# Patient Record
Sex: Female | Born: 1975 | Hispanic: No | Marital: Married | Smoking: Former smoker
Health system: Southern US, Community
[De-identification: ages and names within clinical notes are randomized; demographics above are authoritative.]

## PROBLEM LIST (undated history)

## (undated) DIAGNOSIS — F329 Major depressive disorder, single episode, unspecified: Secondary | ICD-10-CM

## (undated) DIAGNOSIS — F419 Anxiety disorder, unspecified: Secondary | ICD-10-CM

## (undated) DIAGNOSIS — R002 Palpitations: Secondary | ICD-10-CM

## (undated) DIAGNOSIS — K5190 Ulcerative colitis, unspecified, without complications: Secondary | ICD-10-CM

## (undated) DIAGNOSIS — M542 Cervicalgia: Secondary | ICD-10-CM

## (undated) DIAGNOSIS — D649 Anemia, unspecified: Secondary | ICD-10-CM

## (undated) DIAGNOSIS — R079 Chest pain, unspecified: Secondary | ICD-10-CM

## (undated) DIAGNOSIS — N946 Dysmenorrhea, unspecified: Secondary | ICD-10-CM

## (undated) DIAGNOSIS — N938 Other specified abnormal uterine and vaginal bleeding: Secondary | ICD-10-CM

## (undated) DIAGNOSIS — F3281 Premenstrual dysphoric disorder: Secondary | ICD-10-CM

## (undated) DIAGNOSIS — R140 Abdominal distension (gaseous): Secondary | ICD-10-CM

## (undated) DIAGNOSIS — E282 Polycystic ovarian syndrome: Secondary | ICD-10-CM

## (undated) DIAGNOSIS — E320 Persistent hyperplasia of thymus: Secondary | ICD-10-CM

## (undated) DIAGNOSIS — N921 Excessive and frequent menstruation with irregular cycle: Secondary | ICD-10-CM

## (undated) DIAGNOSIS — N3010 Interstitial cystitis (chronic) without hematuria: Secondary | ICD-10-CM

## (undated) DIAGNOSIS — E349 Endocrine disorder, unspecified: Secondary | ICD-10-CM

## (undated) DIAGNOSIS — R000 Tachycardia, unspecified: Secondary | ICD-10-CM

## (undated) DIAGNOSIS — R55 Syncope and collapse: Secondary | ICD-10-CM

## (undated) DIAGNOSIS — Z87440 Personal history of urinary (tract) infections: Secondary | ICD-10-CM

## (undated) DIAGNOSIS — R601 Generalized edema: Secondary | ICD-10-CM

## (undated) DIAGNOSIS — Q231 Congenital insufficiency of aortic valve: Secondary | ICD-10-CM

## (undated) DIAGNOSIS — Z720 Tobacco use: Secondary | ICD-10-CM

## (undated) DIAGNOSIS — R61 Generalized hyperhidrosis: Secondary | ICD-10-CM

## (undated) DIAGNOSIS — F988 Other specified behavioral and emotional disorders with onset usually occurring in childhood and adolescence: Secondary | ICD-10-CM

## (undated) DIAGNOSIS — D249 Benign neoplasm of unspecified breast: Secondary | ICD-10-CM

## (undated) DIAGNOSIS — I471 Supraventricular tachycardia: Secondary | ICD-10-CM

## (undated) HISTORY — DX: Interstitial cystitis (chronic) without hematuria: N30.10

## (undated) HISTORY — DX: Chest pain, unspecified: R07.9

## (undated) HISTORY — DX: Ulcerative colitis, unspecified, without complications: K51.90

## (undated) HISTORY — DX: Tobacco use: Z72.0

## (undated) HISTORY — DX: Abdominal distension (gaseous): R14.0

## (undated) HISTORY — DX: Other specified abnormal uterine and vaginal bleeding: N93.8

## (undated) HISTORY — DX: Personal history of urinary (tract) infections: Z87.440

## (undated) HISTORY — DX: Polycystic ovarian syndrome: E28.2

## (undated) HISTORY — DX: Other specified behavioral and emotional disorders with onset usually occurring in childhood and adolescence: F98.8

## (undated) HISTORY — DX: Endocrine disorder, unspecified: E34.9

## (undated) HISTORY — DX: Persistent hyperplasia of thymus: E32.0

## (undated) HISTORY — DX: Tachycardia, unspecified: R00.0

## (undated) HISTORY — DX: Premenstrual dysphoric disorder: F32.81

## (undated) HISTORY — DX: Major depressive disorder, single episode, unspecified: F32.9

## (undated) HISTORY — DX: Benign neoplasm of unspecified breast: D24.9

## (undated) HISTORY — DX: Congenital insufficiency of aortic valve: Q23.1

## (undated) HISTORY — DX: Excessive and frequent menstruation with irregular cycle: N92.1

## (undated) HISTORY — DX: Dysmenorrhea, unspecified: N94.6

## (undated) HISTORY — DX: Generalized edema: R60.1

## (undated) HISTORY — DX: Syncope and collapse: R55

## (undated) HISTORY — DX: Anxiety disorder, unspecified: F41.9

## (undated) HISTORY — DX: Supraventricular tachycardia: I47.1

## (undated) HISTORY — PX: OVARIAN CYST REMOVAL: SHX89

## (undated) HISTORY — DX: Generalized hyperhidrosis: R61

## (undated) HISTORY — DX: Palpitations: R00.2

## (undated) HISTORY — DX: Cervicalgia: M54.2

## (undated) HISTORY — DX: Anemia, unspecified: D64.9

---

## 2006-08-23 DIAGNOSIS — G4761 Periodic limb movement disorder: Secondary | ICD-10-CM | POA: Insufficient documentation

## 2006-08-23 HISTORY — DX: Periodic limb movement disorder: G47.61

## 2010-05-19 ENCOUNTER — Encounter: Admission: RE | Admit: 2010-05-19 | Discharge: 2010-06-10 | Payer: Self-pay | Admitting: Obstetrics and Gynecology

## 2013-12-26 ENCOUNTER — Ambulatory Visit: Payer: Self-pay | Admitting: Podiatrist

## 2014-07-17 DIAGNOSIS — F329 Major depressive disorder, single episode, unspecified: Secondary | ICD-10-CM

## 2014-07-17 DIAGNOSIS — F32A Depression, unspecified: Secondary | ICD-10-CM

## 2014-07-17 HISTORY — DX: Depression, unspecified: F32.A

## 2014-07-17 HISTORY — DX: Major depressive disorder, single episode, unspecified: F32.9

## 2014-11-16 ENCOUNTER — Ambulatory Visit (INDEPENDENT_AMBULATORY_CARE_PROVIDER_SITE_OTHER): Payer: BC Managed Care – PPO | Admitting: Certified Nurse Midwife

## 2014-11-16 ENCOUNTER — Encounter: Payer: Self-pay | Admitting: Certified Nurse Midwife

## 2014-11-16 VITALS — BP 120/80 | HR 78 | Resp 16 | Ht 61.75 in | Wt 145.8 lb

## 2014-11-16 DIAGNOSIS — Z3009 Encounter for other general counseling and advice on contraception: Secondary | ICD-10-CM

## 2014-11-16 DIAGNOSIS — Z0000 Encounter for general adult medical examination without abnormal findings: Secondary | ICD-10-CM

## 2014-11-16 DIAGNOSIS — N852 Hypertrophy of uterus: Secondary | ICD-10-CM

## 2014-11-16 LAB — POCT URINALYSIS DIPSTICK
LEUKOCYTES UA: NEGATIVE
PH UA: 5
Urobilinogen, UA: NEGATIVE

## 2014-11-16 LAB — POCT URINE PREGNANCY: Preg Test, Ur: NEGATIVE

## 2014-11-16 LAB — HEMOGLOBIN, FINGERSTICK: Hemoglobin, fingerstick: 11 g/dL — ABNORMAL LOW (ref 12.0–16.0)

## 2014-11-16 NOTE — Progress Notes (Signed)
39 y.o. G12P2002 Married  Caucasian Fe here to establish gyn care and to consider IUD and discussion of abnormal pap smear of ASCUS with negative HPVHR per record. Patient was told she would have IUD placed today and is currently on her menses. Patient also related that her cycle was 20 days late. Previous Nuvaring use with not consistent use. Patient very irritable in discussing that we do not insert IUD's with out consultation and pelvic exam and if there is any concern with pregnancy or no contraception. Patient visibly upset, but would like information on Mirena IUD. Patient next aex due 6/16. Last one with PCP. Patient also had abnormal pap smear which was ASCUS and per patient had to request after research that a HPVHR be done. Patient records received while discussing with patient. Patient takes Xanax prn for anxiety per PCP. Patient has history of C/Section x 2 and ovarian cyst removal. left. No other health problems today. Patient consents to pelvic exam, if needed. Patient's last menstrual period was 11/14/2014.          Sexually active: Yes.    The current method of family planning is none.    Exercising: No.  The patient does not participate in regular exercise at present. Smoker:  Yes, less than half a pack  Health Maintenance: Pap: 04/16/14   ASCUS , per patient had HPV testing done and that was negative. Per records HPVHR negative MMG:  6 months ago, has had a hx of abnormalities is due for mammogram under follow up do not have this information in record TDaP:  Within 10 years  Labs: Hgb: 11.0 ; Urine: Blood ++ (Patient is on cycle), Upt-neg   reports that she has been smoking.  She has never used smokeless tobacco. She reports that she drinks alcohol.  Past Medical History  Diagnosis Date  . Anemia   . Anxiety   . Hormone disorder   . History of recurrent UTIs   . Interstitial cystitis     Past Surgical History  Procedure Laterality Date  . Cesarean section  x2  . Ovarian  cyst removal  Age 51    Current Outpatient Prescriptions  Medication Sig Dispense Refill  . ALPRAZolam (XANAX) 0.25 MG tablet Take 0.25 mg by mouth as needed for anxiety. Breaks in half.    . traMADol (ULTRAM) 50 MG tablet Take by mouth as needed.     No current facility-administered medications for this visit.    Family History  Problem Relation Age of Onset  . Endometriosis Sister   . Endometriosis Mother   . Endometriosis Maternal Aunt   . Endometriosis Maternal Aunt   . Heart attack Paternal Grandmother   . Stroke Mother   . Hypertension Father   . Hypertension Sister   . Hypertension Paternal Grandmother     ROS:  Pertinent items are noted in HPI.  Otherwise, a comprehensive ROS was negative.  Exam:   BP 120/80 mmHg  Pulse 78  Resp 16  Ht 5' 1.75" (1.568 m)  Wt 145 lb 12.8 oz (66.134 kg)  BMI 26.90 kg/m2  LMP 11/14/2014 Height: 5' 1.75" (156.8 cm) Ht Readings from Last 3 Encounters:  11/16/14 5' 1.75" (1.568 m)    General appearance: alert, cooperative and appears stated age Abdomen: soft, non-tender; firmness noted midline Skin: Skin warm and dry No abnormal inguinal nodes palpated Neurologic: Grossly normal   Pelvic: External genitalia:  no lesions  Urethra:  normal appearing urethra with no masses, tenderness or lesions              Bartholin's and Skene's: normal                 Vagina: normal appearing vagina with normal color and bloody discharge, no lesions              Cervix: parous, non tender, no lesions              Pap taken: No. Bimanual Exam:  Uterus:  enlarged, 14-16 weeks size, firm and mobile              Adnexa: no mass, fullness, tenderness and adnexa not palpable               Rectovaginal: Confirms               Anus:  normal sphincter tone, no lesions  POCT UPT: negative  Chaperone present: No  A:  Contraception desired Mirena IUD  Normal pelvic exam with enlarged uterus noted  Irregular cycle due to stopping  Nuvaring  History of ASCUS Pap with negative HPVHR  Smoker  P:   Discussed risks/benefits/bleeding profile of Mirena IUD. Questions addressed. Discussed enlarged uterus and that is why pelvic exam evaluation needed prior to ordering IUD. Discussed possible etiology of fibroid, adenomyosis or mass. Recommend PUS for evaluation and can determine if IUD candidate. Patient agreeable. Discussed with patient she will be called with insurance information and will be scheduled. Discussed consistent condom use to prevent pregnancy. Reviewed Pap smear results and discussed per ASCCP guidelines repeat pap smear in one year from date obtained recommended. Questions addressed. Patient become less anxious and angry by the end of visit. Will obtain records from mammogram , patient aware. She plans to have aex here.  Rv as above, prn  An After Visit Summary was printed and given to the patient.

## 2014-11-16 NOTE — Patient Instructions (Signed)
Intrauterine Device Information An intrauterine device (IUD) is inserted into your uterus to prevent pregnancy. There are two types of IUDs available:   Copper IUD--This type of IUD is wrapped in copper wire and is placed inside the uterus. Copper makes the uterus and fallopian tubes produce a fluid that kills sperm. The copper IUD can stay in place for 10 years.  Hormone IUD--This type of IUD contains the hormone progestin (synthetic progesterone). The hormone thickens the cervical mucus and prevents sperm from entering the uterus. It also thins the uterine lining to prevent implantation of a fertilized egg. The hormone can weaken or kill the sperm that get into the uterus. One type of hormone IUD can stay in place for 5 years, and another type can stay in place for 3 years. Your health care provider will make sure you are a good candidate for a contraceptive IUD. Discuss with your health care provider the possible side effects.  ADVANTAGES OF AN INTRAUTERINE DEVICE  IUDs are highly effective, reversible, long acting, and low maintenance.   There are no estrogen-related side effects.   An IUD can be used when breastfeeding.   IUDs are not associated with weight gain.   The copper IUD works immediately after insertion.   The hormone IUD works right away if inserted within 7 days of your period starting. You will need to use a backup method of birth control for 7 days if the hormone IUD is inserted at any other time in your cycle.  The copper IUD does not interfere with your female hormones.   The hormone IUD can make heavy menstrual periods lighter and decrease cramping.   The hormone IUD can be used for 3 or 5 years.   The copper IUD can be used for 10 years. DISADVANTAGES OF AN INTRAUTERINE DEVICE  The hormone IUD can be associated with irregular bleeding patterns.   The copper IUD can make your menstrual flow heavier and more painful.   You may experience cramping and  vaginal bleeding after insertion.  Document Released: 09/22/2004 Document Revised: 06/21/2013 Document Reviewed: 04/09/2013 [facility] Patient Information 2015 [place], [place]. This information is not intended to replace advice given to you by your health care provider. Make sure you discuss any questions you have with your health care provider.

## 2014-11-18 NOTE — Progress Notes (Signed)
Reviewed personally.  M. Suzanne Braileigh Landenberger, MD.  

## 2014-11-26 ENCOUNTER — Telehealth: Payer: Self-pay | Admitting: Certified Nurse Midwife

## 2014-11-26 NOTE — Telephone Encounter (Signed)
Left message for patient to call back. Need to go over benefits and schedule PUS. Pr $30

## 2014-11-29 NOTE — Telephone Encounter (Signed)
Patient returning call.

## 2014-11-30 NOTE — Telephone Encounter (Signed)
Call to patient. Phone continues to ring/no answer/no voicemail.

## 2014-12-07 ENCOUNTER — Other Ambulatory Visit: Payer: Self-pay

## 2014-12-07 DIAGNOSIS — N852 Hypertrophy of uterus: Secondary | ICD-10-CM

## 2014-12-07 NOTE — Telephone Encounter (Signed)
Spoke with patient. Advised that per benefit quote received, she will be responsible for $30 copay. Scheduled PUS. Advised patient of 72 hour cancellation policy and $053 cancellation fee. Patient agreeable.

## 2015-01-03 ENCOUNTER — Ambulatory Visit (INDEPENDENT_AMBULATORY_CARE_PROVIDER_SITE_OTHER): Payer: BC Managed Care – PPO

## 2015-01-03 ENCOUNTER — Encounter: Payer: Self-pay | Admitting: Obstetrics & Gynecology

## 2015-01-03 ENCOUNTER — Ambulatory Visit (INDEPENDENT_AMBULATORY_CARE_PROVIDER_SITE_OTHER): Payer: BC Managed Care – PPO | Admitting: Obstetrics & Gynecology

## 2015-01-03 VITALS — BP 110/72 | Wt 146.0 lb

## 2015-01-03 DIAGNOSIS — N852 Hypertrophy of uterus: Secondary | ICD-10-CM | POA: Diagnosis not present

## 2015-01-03 DIAGNOSIS — N943 Premenstrual tension syndrome: Secondary | ICD-10-CM

## 2015-01-03 DIAGNOSIS — N938 Other specified abnormal uterine and vaginal bleeding: Secondary | ICD-10-CM | POA: Diagnosis not present

## 2015-01-03 DIAGNOSIS — N946 Dysmenorrhea, unspecified: Secondary | ICD-10-CM

## 2015-01-03 DIAGNOSIS — F3281 Premenstrual dysphoric disorder: Secondary | ICD-10-CM

## 2015-01-03 DIAGNOSIS — N921 Excessive and frequent menstruation with irregular cycle: Secondary | ICD-10-CM

## 2015-01-03 MED ORDER — FLUOXETINE HCL 20 MG PO CAPS: 20.0000 mg | ORAL_CAPSULE | Freq: Every day | ORAL | Status: DC

## 2015-01-03 MED ORDER — TRAMADOL HCL 50 MG PO TABS: 50.0000 mg | ORAL_TABLET | Freq: Four times a day (QID) | ORAL | Status: DC | PRN

## 2015-01-03 NOTE — Progress Notes (Signed)
39 y.o. Courtney Goodwin here for a pelvic ultrasound.  Pt was seen 11/16/14 for new pt appt here due to h/o abnormal pap smear and due to irregular and heavy bleeding.  She has gone up to seven weeks between cycles.  When this happens, flow is much heavier and she has a lot more cramping, as well.  Pt has hx of cesarean section x 2 for birth of children.  On physical exam, that day, uterus was noted to be enlarged.  Therefore, PUS was recommended.  Pt is most interested in proceeding with IUD, if appropriate.    Also, pt wants to talk about severe, almost debilitating, PMS symptoms.  She thinks it is silly that these symptoms are so severe but she is almost not like herself on these days.  Experiences severe mood swings, almost like depression, except that is resolves as cycle ends.  She feels short and mean to her family.  Would like to consider if there are any options.    Sexually active:  yes  Contraception: no method  FINDINGS: UTERUS: 9.6 x 5.6 x 4.8cm.  No fibroids noted. EMS: 48mm ADNEXA:   Left ovary 3.9 x 3.0 x 2.8cm   Right ovary 3.8 x 2.9 x 2.8cm.  Increased number of follicles but doesn't meet PCOS criteria.  Although upon discussing with pt, she reports she has been told this in the past.   CUL DE SAC: no free fluid  Findings discussed and images reviewed.  Ultrasound shows uterus to be smaller than on physical exam so IUD placement is appropriate.  Pt knows she needs to call with onset of her cycle for IUD placement.  She should take 800mg  Motrin 1 hour before appt as well.    PMDD discussed.  Treatment options discussed.  She doesn't feel she needs something all of the time.  Cyclic Prozac use discussed.  Side effects of increased GI symptoms/nausea discussed.  As well, serotonin syndrome discussed.  Pt knows to call and d/c medication if she has any of these symptoms.  Rx given for daily use instead of 15 days starting with cycle in case pt feels better with medication.    Pt also  asks for refill on Tramadol which she has been given for uterine cramping.  It does really seem to help for her.  Assessment:  DUB with menorrhagia, need for BC, increased ovarian follicles without meeting criteria for PCOS, dysmenorrhea  Plan:  Return for IUD placement.  Pt to call with onset of symptoms. Tramadol rx to pt. Prozac 20mg  daily starting with menses for 15 days.  Can take daily if feels really helps.  Will discuss at follow up. Consider endometrial biopsy at follow up IUD placement.  ~30 minutes spent with patient >50% of time was in face to face discussion of above.

## 2015-01-10 ENCOUNTER — Encounter: Payer: Self-pay | Admitting: Obstetrics & Gynecology

## 2015-01-10 DIAGNOSIS — N946 Dysmenorrhea, unspecified: Secondary | ICD-10-CM | POA: Insufficient documentation

## 2015-01-10 DIAGNOSIS — N921 Excessive and frequent menstruation with irregular cycle: Secondary | ICD-10-CM

## 2015-01-10 DIAGNOSIS — F3281 Premenstrual dysphoric disorder: Secondary | ICD-10-CM

## 2015-01-10 DIAGNOSIS — N938 Other specified abnormal uterine and vaginal bleeding: Secondary | ICD-10-CM | POA: Insufficient documentation

## 2015-01-10 HISTORY — DX: Dysmenorrhea, unspecified: N94.6

## 2015-01-10 HISTORY — DX: Premenstrual dysphoric disorder: F32.81

## 2015-01-10 HISTORY — DX: Excessive and frequent menstruation with irregular cycle: N92.1

## 2015-01-10 HISTORY — DX: Other specified abnormal uterine and vaginal bleeding: N93.8

## 2015-02-06 ENCOUNTER — Telehealth: Payer: Self-pay | Admitting: Certified Nurse Midwife

## 2015-02-06 NOTE — Telephone Encounter (Signed)
Patient calling with additional questions from earlier telephone call. See last phone note.

## 2015-02-06 NOTE — Telephone Encounter (Signed)
Patient calling to report she started her menstrual cycle on 02/04/15. She is calling to schedule her Mirena insertion.

## 2015-02-06 NOTE — Telephone Encounter (Signed)
Spoke with patient. Patient states that she started her cycle on 4/4 and would like to schedule IUD insertion. Offered appointment at 10 am on 4/8 but patient declines due to work schedule. Offered appointment 4/8 at 1:30pm but patient declines due to work schedule. Patient requesting Monday afternoon appointment. Advised with IUD insertion recommended insert time is by day 7. As Monday will be day 8 will need to speak with Dr.Miller regarding scheduling and return call. Patient is agreeable.

## 2015-02-07 NOTE — Telephone Encounter (Signed)
Pt is sexually active and not using any other form of BC so must be within first seven days of cycle onset.  You are correct with your recommendations.

## 2015-02-07 NOTE — Telephone Encounter (Signed)
Left message to call Kaiea Esselman at [phone]. 

## 2015-02-15 NOTE — Telephone Encounter (Signed)
Dr.Miller, patient did not return call to schedule IUID insertion with last cycle. Patient is aware of need to call with first day of menses and need to be within the first 7 days of cycle onset. Okay to close this encounter?

## 2015-02-15 NOTE — Telephone Encounter (Signed)
Yes.  Ok to close encounter. 

## 2015-02-18 ENCOUNTER — Telehealth: Payer: Self-pay | Admitting: Obstetrics & Gynecology

## 2015-02-18 ENCOUNTER — Other Ambulatory Visit: Payer: Self-pay | Admitting: Obstetrics & Gynecology

## 2015-02-18 ENCOUNTER — Telehealth: Payer: Self-pay | Admitting: Emergency Medicine

## 2015-02-18 NOTE — Telephone Encounter (Signed)
Pt called stating she has been experiencing severe RLQ pain for the last several hours.  Now she cannot stand up straight due to the pain.  Mild nausea.  Denies fever.  Called to see if she could be seen immediately.  As I am in the operating room right now and office is not open for another hour, that is not possible.  Advised pt be seen in ER and she is relieved I said to go on now and be evaluated.  Reminded pt she does not need my permission to go to the ER. She felt she needed to call first.  Appreciative of call back.  Will follow up with pt later today.  CC:  Tracy Fast.  Please check on pt later today, please.

## 2015-02-18 NOTE — Telephone Encounter (Signed)
See prior phone note. 

## 2015-02-18 NOTE — Telephone Encounter (Signed)
Called patient. She is home from ER. Had CT scan in ER that showed multiple small cysts on L ovary with some fluid per patient. She states "there was nothing large enough to be removed." Patient reports husband currently under treatment for epididymitis. Patient states that urine and blood work were normal. She is using oxycodone for pain and it helps some. States no pelvic exam was done while in ER.  Patient is scheduled for follow up visit with Dr. Sabra Heck for 02/21/15 at 1245. She is advised to bring records from visit for review. She is advised to call back if pain worsens, fevers develop, or any concerning symptom. Patient agreeable.

## 2015-02-19 NOTE — Telephone Encounter (Signed)
Message left to return call to Tobin Witucki at [phone].    

## 2015-02-20 NOTE — Telephone Encounter (Signed)
Left message to call Mayjor Ager at [phone]. 

## 2015-02-20 NOTE — Telephone Encounter (Signed)
Patient canceled her 02/21/15 appointment for "follow up er." Patient would like to reschedule.Dr.Miller's next short appointment is 03/08/15. Patient is willing to see any provider to accommodate her schedule.  Last seen 01/03/15.

## 2015-02-21 ENCOUNTER — Ambulatory Visit: Payer: BC Managed Care – PPO | Admitting: Obstetrics & Gynecology

## 2015-02-22 NOTE — Telephone Encounter (Signed)
Left message at cell number provided [phone]. Left message at home number [phone].

## 2015-02-25 NOTE — Telephone Encounter (Signed)
Left message at home number [phone] to call Kaitlyn at [phone]. Left message at cell number [phone] to call Kaitlyn at [phone].

## 2015-02-26 NOTE — Telephone Encounter (Signed)
Spoke with patient. Patient would like to reschedule follow up from ED visit. Patient experiencing left lower quadrant pain. States had ultrasound at ED which revealed cysts on left ovary which MD stated might need removal and needs follow up with GYN. 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 Is still having discomfort on left side. "It is not where near what it was those two days I went to the ER but it is still there." Appointment scheduled for follow up tomorrow 4/27 at 3:30pm with Dr.Silva as Dr.Miller will be out of the office the rest of this week. Patient is agreeable to date and time.  Routing to provider for final review. Patient agreeable to disposition. Will close encounter

## 2015-02-27 ENCOUNTER — Ambulatory Visit (INDEPENDENT_AMBULATORY_CARE_PROVIDER_SITE_OTHER): Payer: BC Managed Care – PPO | Admitting: Obstetrics and Gynecology

## 2015-02-27 ENCOUNTER — Encounter: Payer: Self-pay | Admitting: Obstetrics and Gynecology

## 2015-02-27 VITALS — BP 118/62 | Ht 61.75 in | Wt 145.0 lb

## 2015-02-27 DIAGNOSIS — Z113 Encounter for screening for infections with a predominantly sexual mode of transmission: Secondary | ICD-10-CM | POA: Diagnosis not present

## 2015-02-27 DIAGNOSIS — R1032 Left lower quadrant pain: Secondary | ICD-10-CM

## 2015-02-27 MED ORDER — TRAMADOL HCL 50 MG PO TABS: 50.0000 mg | ORAL_TABLET | Freq: Four times a day (QID) | ORAL | Status: DC | PRN

## 2015-02-27 NOTE — Progress Notes (Signed)
Patient ID: Courtney Goodwin, female   DOB: Dec 03, 1975, 39 y.o.   MRN: [id number] GYNECOLOGY  VISIT   HPI: 39 y.o.   Married  Caucasian  female   G2P2002 with Patient's last menstrual period was 02/04/2015 (approximate).   here for follow up visit from [facility] ER on 02-18-15.   Seen for acute LLQ pain which limited ability to walk.  Had a CT scan which showed left ovarian cysts and free fluid per patient.  Patient was told to follow up with GYN to discuss possible removal of ovary.  Pain is a one out of 10 today.   Husband diagnosed with prostatitis/epididymitis.  Patient concerned about this.  Not clear what the actual diagnosis was and if it were an STD.  Requests full STD check.   History of abnormal bleeding for a few months.  Ultrasound 01/03/15 in office here showed bilateral ovarian cysts and normal uterus.  Was considering Mirena but hard to get in for an appointment.   Has nausea with oral contraceptive birth control.   History of ovulatory pain every month.  Requests Tramadol Rx.  States her previous prescription lost.   GYNECOLOGIC HISTORY: Patient's last menstrual period was 02/04/2015 (approximate). Contraception: Vasectomy   Menopausal hormone therapy: n/a Last pap:  04-16-14:  ASCUS: no HR HPV testing done([facility]--no Hx abnormal paps)        OB History    Gravida Para Term Preterm AB TAB SAB Ectopic Multiple Living   2 2 2       2          Patient Active Problem List   Diagnosis Date Noted  . PMDD (premenstrual dysphoric disorder) 01/10/2015  . Dysmenorrhea 01/10/2015  . DUB (dysfunctional uterine bleeding) 01/10/2015  . Menorrhagia with irregular cycle 01/10/2015    Past Medical History  Diagnosis Date  . Anemia   . Anxiety   . Hormone disorder   . History of recurrent UTIs   . Interstitial cystitis     Past Surgical History  Procedure Laterality Date  . Cesarean section  x2  . Ovarian cyst removal  Age 49    Current Outpatient  Prescriptions  Medication Sig Dispense Refill  . ALPRAZolam (XANAX) 0.25 MG tablet Take 0.25 mg by mouth as needed for anxiety. Breaks in half.    Marland Kitchen FLUoxetine (PROZAC) 20 MG capsule Take 1 capsule (20 mg total) by mouth daily. 30 capsule 11  . traMADol (ULTRAM) 50 MG tablet Take 1 tablet (50 mg total) by mouth every 6 (six) hours as needed. 30 tablet 2  . oxyCODONE-acetaminophen (PERCOCET/ROXICET) 5-325 MG per tablet Take 1 tablet by mouth as needed.     No current facility-administered medications for this visit.     ALLERGIES: Review of patient's allergies indicates no known allergies.  Family History  Problem Relation Age of Onset  . Endometriosis Sister   . Endometriosis Mother   . Endometriosis Maternal Aunt   . Endometriosis Maternal Aunt   . Heart attack Paternal Grandmother   . Stroke Mother   . Hypertension Father   . Hypertension Sister   . Hypertension Paternal Grandmother   . Uterine cancer Other     paternal great GM    History   Social History  . Marital Status: Married    Spouse Name: N/A  . Number of Children: N/A  . Years of Education: N/A   Occupational History  . Not on file.   Social History Main Topics  .  Smoking status: Current Some Day Smoker -- 0.25 packs/day for 20 years    Types: Cigarettes  . Smokeless tobacco: Never Used     Comment: less than half a pack  . Alcohol Use: 1.2 oz/week    2 Standard drinks or equivalent per week     Comment: couple a week   . Drug Use: No  . Sexual Activity:    Partners: Male    Birth Control/ Protection: Surgical     Comment: Vasectomy   Other Topics Concern  . Not on file   Social History Narrative    ROS:  Pertinent items are noted in HPI.  PHYSICAL EXAMINATION:    BP 118/62 mmHg  Ht 5' 1.75" (1.568 m)  Wt 145 lb (65.772 kg)  BMI 26.75 kg/m2  LMP 02/04/2015 (Approximate)     General appearance: alert, cooperative and appears stated age Lungs: clear to auscultation bilaterally Heart:  regular rate and rhythm Abdomen: soft, non-tender; no masses,  no organomegaly No abnormal inguinal nodes palpated  Pelvic: External genitalia:  no lesions              Urethra:  normal appearing urethra with no masses, tenderness or lesions              Bartholins and Skenes: normal                 Vagina: normal appearing vagina with normal color and discharge, no lesions              Cervix: normal appearance                   Bimanual Exam:  Uterus:  uterus is normal size, shape, consistency and nontender                                      Adnexa: normal adnexa in size, nontender and no masses                                      Rectovaginal:  Yes.                                                                Confirms                                      Anus:  normal sphincter tone, no lesions  ASSESSMENT  LLQ pain episode.  Suspicious for ovarian cyst rupture.  Ovulatory pain.  No signs of cervicitis or PID today.  Partner with prostatitis or epididymitis. Desire for STD check.  Prior ultrasound suggestive of PCOS. Hx ASCUS pap 2015.   PLAN  Will get CT scan report.  Discussion of ovarian cysts.  Discussion of Mirena and that it does not prevent cyst formation on ovaries but can control abnormal bleeding/dysmenorrhea/pelvic pain. STD testing.  Tramadol Rx.  See orders.  Repeat pap due with annual exam this year.   An After Visit Summary was printed and given to the patient.  __25_____ minutes face to face time of which over 50% was spent in counseling.

## 2015-02-28 LAB — STD PANEL
HEP B S AG: NEGATIVE
HIV 1&2 Ab, 4th Generation: NONREACTIVE

## 2015-02-28 LAB — GC/CHLAMYDIA PROBE AMP, URINE
Chlamydia, Swab/Urine, PCR: NEGATIVE
GC Probe Amp, Urine: NEGATIVE

## 2015-02-28 LAB — HEPATITIS C ANTIBODY: HCV Ab: NEGATIVE

## 2015-03-01 ENCOUNTER — Telehealth: Payer: Self-pay | Admitting: Obstetrics and Gynecology

## 2015-03-01 DIAGNOSIS — Z30430 Encounter for insertion of intrauterine contraceptive device: Secondary | ICD-10-CM

## 2015-03-01 NOTE — Telephone Encounter (Signed)
Patient calling to get her results she is very anxious and wants to her from someone today.

## 2015-03-01 NOTE — Telephone Encounter (Signed)
Spoke with patient. Advised patient that per review of results from 4/27 GC/Chlamydia, Hep B, HIV, RPR, and Hep C all came back negative. Advised Dr.Silva will review and if anything further is needed will return call. Patient is agreeable.

## 2015-03-01 NOTE — Telephone Encounter (Signed)
Thank you for communicating negative STD results to patient.  CT of the abdomen and pelvis showed cystic change of both ovaries.  No specific mass was seen.  Patient did have note of a 12 mm mass in the left breast, which may be a fibroadenoma.  Report states that this correlates with what was seen on her mammogram in June 2014.   I would suggest she have a breast recheck when she returns for her appointment with Dr. Sabra Heck.   Annual exam due in June.   Was also considering Mirena IUD.   CT report to Dr. Ammie Ferrier desk.

## 2015-03-03 HISTORY — PX: INTRAUTERINE DEVICE (IUD) INSERTION: SHX5877

## 2015-03-04 NOTE — Telephone Encounter (Signed)
Left message to call Kaitlyn at [phone]. 

## 2015-03-06 NOTE — Telephone Encounter (Signed)
Spoke with patient. Advised of message as seen below from Ramona. Patient is agreeable and verbalizes understanding. Patient would like to proceed with scheduling Mirena insertion at this time. Requesting appointment this week. Current form of birth control is husband has vasectomy. [id number]. Appointment scheduled for tomorrow at 3:30pm with Dr.Miller. Patient is agreeable to date and time.   Routing to provider for final review. Patient agreeable to disposition. Patient aware MD will review message and nurse will return call with any additional instructions or change of disposition. Will close encounter.

## 2015-03-06 NOTE — Telephone Encounter (Signed)
Left message to call Farran Amsden at [phone]. 

## 2015-03-07 ENCOUNTER — Ambulatory Visit (INDEPENDENT_AMBULATORY_CARE_PROVIDER_SITE_OTHER): Payer: BC Managed Care – PPO | Admitting: Obstetrics & Gynecology

## 2015-03-07 VITALS — BP 104/62 | HR 64 | Resp 16 | Wt 143.8 lb

## 2015-03-07 DIAGNOSIS — Z30430 Encounter for insertion of intrauterine contraceptive device: Secondary | ICD-10-CM

## 2015-03-07 DIAGNOSIS — N921 Excessive and frequent menstruation with irregular cycle: Secondary | ICD-10-CM

## 2015-03-07 DIAGNOSIS — D249 Benign neoplasm of unspecified breast: Secondary | ICD-10-CM

## 2015-03-07 LAB — POCT URINE PREGNANCY: Preg Test, Ur: NEGATIVE

## 2015-03-07 NOTE — Progress Notes (Signed)
Scheduled patient for follow up 6 month bilateral breast ultrasound with bilateral diagnostic mammogram at [facility] for 5/16 at 3:10pm. Patient is agreeable to date and time.

## 2015-03-07 NOTE — Progress Notes (Signed)
Subjective:     Patient ID: Courtney Goodwin, female   DOB: [birth year], 39 y.o.   MRN: [id number]  HPI 39 yo G2P2 MWF here for possible IUD placement but to also follow up from ER visit from 02/18/15.  Pt had CT while there showing "cysts".  Report reviewed with pt.  Nothing more specific than this was noted.  There was a possible 2.4cm fibroadenoma on the left breast as well.  Pt denies pain, skin change, nipple discharge.  Has been followed at [facility] for abnormal breast findings.  Do have prior reports today to review with pt which was done while she was in the office.  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  No biopsy has been done.  Pt was supposed to have follow up in January but didn't go.  Didn't realize it was due.  Willing to complete follow up.  Pt also didn't realized fibroadenomas were on both sides.  She can feel the one on the right but not on the left.  IUD placement is planned due to menorrhagia with irregular cycles as well as for contraception.  Pt has not been on anything for contraception in several years.  LMP; 03/03/15.  UPT today negative   Review of Systems  All other systems reviewed and are negative.      Objective:   Physical Exam  Constitutional: She is oriented to person, place, and time. She appears well-developed and well-nourished.  Neck: Normal range of motion. No thyromegaly present.  Pulmonary/Chest: Right breast exhibits mass. Right breast exhibits no inverted nipple, no nipple discharge, no skin change and no tenderness. Left breast exhibits no inverted nipple, no mass, no nipple discharge, no skin change and no tenderness. Breasts are symmetrical.    Genitourinary: Vagina normal and uterus normal.  Lymphadenopathy:    She has no cervical adenopathy.  Neurological: She is alert and oriented to person, place, and time.  Psychiatric: She has a normal mood and affect.  Vitals reviewed.  After patient read information  booklet and all questions were answered, informed consent was obtained.      Procedure:  Speculum inserted into vagina. Cervix visualized and cleansed with betadine solution X 3. Tenaculum placed on cervix at 12 o'clock position.  Uterus sounded to 9 centimeters.  Mirena IUD and inserting device removed from sterile packet and under sterile conditions inserted to fundus of uterus.  IUD released and introducer removed without difficulty.  IUD string trimmed to 2 centimeters.  Remainder string given to patient to feel for identification.  Tenaculum removed.  Minimal bleeding noted.  Speculum removed.  Uterus palpated normal.  Patient tolerated procedure well.  Package information attached to consent and scanned into EPIC.    Assessment:     Breast fibroadenoma, on right and left, overdue for follow up Insertion of Mirena  Menorrhagia with irregular cycles Needs for contraception     Plan:     MMG with bilateral ultrasound at [facility] for follow up breast fibroadenomas AEX and IUD follow up in 6-8 weeks.    Return to office 4-6 weeks for recheck   Pt knows IUD needs to be replaced approximately 5 years, no later than 03/06/2020.  Instructions provided.

## 2015-03-10 ENCOUNTER — Encounter: Payer: Self-pay | Admitting: Obstetrics & Gynecology

## 2015-03-10 DIAGNOSIS — D249 Benign neoplasm of unspecified breast: Secondary | ICD-10-CM | POA: Insufficient documentation

## 2015-03-10 HISTORY — DX: Benign neoplasm of unspecified breast: D24.9

## 2015-03-14 ENCOUNTER — Telehealth: Payer: Self-pay | Admitting: Emergency Medicine

## 2015-03-14 NOTE — Telephone Encounter (Signed)
Spoke with patient. She is given message from Dr. Sabra Heck and verbalized understanding of results.  She has an annual exam with Dr. Sabra Heck scheduled for 05/10/15.  Routing to provider for final review. Patient agreeable to disposition.   Will close encounter.

## 2015-03-14 NOTE — Telephone Encounter (Signed)
-----   Message from Megan Salon, MD sent at 03/11/2015  3:33 PM EDT ----- Inform pt endometrial biopsy was negative for abnormal cells.  IUD was placed same day.  Pt should have follow up for IUD placement in about six weeks.  Thanks.

## 2015-05-10 ENCOUNTER — Ambulatory Visit: Payer: BC Managed Care – PPO | Admitting: Obstetrics & Gynecology

## 2015-05-10 ENCOUNTER — Telehealth: Payer: Self-pay | Admitting: Obstetrics & Gynecology

## 2015-05-10 NOTE — Telephone Encounter (Signed)
Patient called and cancelled her IUD recheck and AEX today due to a sick child. She rescheduled for 05/13/15 for IUD recheck with Dr. Sabra Heck. If there is not enough time to see the patient for her AEX on 05/13/15 as well, she is willing to come back on another day, or see a different provider if needed.

## 2015-05-10 NOTE — Telephone Encounter (Signed)
Forwarding this to [place] for scheduling.  Ok to close encounter once scheduling is complete.

## 2015-05-13 ENCOUNTER — Ambulatory Visit (INDEPENDENT_AMBULATORY_CARE_PROVIDER_SITE_OTHER): Payer: BC Managed Care – PPO | Admitting: Obstetrics and Gynecology

## 2015-05-13 ENCOUNTER — Encounter: Payer: Self-pay | Admitting: Obstetrics & Gynecology

## 2015-05-13 VITALS — BP 112/72 | HR 82 | Resp 16 | Ht 61.0 in | Wt 145.5 lb

## 2015-05-13 DIAGNOSIS — Z124 Encounter for screening for malignant neoplasm of cervix: Secondary | ICD-10-CM

## 2015-05-13 DIAGNOSIS — D249 Benign neoplasm of unspecified breast: Secondary | ICD-10-CM

## 2015-05-13 DIAGNOSIS — Z0000 Encounter for general adult medical examination without abnormal findings: Secondary | ICD-10-CM | POA: Diagnosis not present

## 2015-05-13 DIAGNOSIS — Z01419 Encounter for gynecological examination (general) (routine) without abnormal findings: Secondary | ICD-10-CM

## 2015-05-13 DIAGNOSIS — Z862 Personal history of diseases of the blood and blood-forming organs and certain disorders involving the immune mechanism: Secondary | ICD-10-CM

## 2015-05-13 DIAGNOSIS — K5900 Constipation, unspecified: Secondary | ICD-10-CM | POA: Diagnosis not present

## 2015-05-13 DIAGNOSIS — Z13220 Encounter for screening for lipoid disorders: Secondary | ICD-10-CM | POA: Diagnosis not present

## 2015-05-13 LAB — POCT URINALYSIS DIPSTICK
Leukocytes, UA: NEGATIVE
Urobilinogen, UA: NEGATIVE
pH, UA: 5

## 2015-05-13 NOTE — Progress Notes (Signed)
39 y.o. [id number] MarriedCaucasianF here for annual exam.  The patient had a mirena IUD placed 2 months ago for irregular heavy cycles. Bleeding daily since insertion. Using light tampons, could leave it in for 8 hours. She has PMDD, feels it is happening more than prior to her mirena. Prior to the mirena symptoms started 2 weeks prior to her cycle. Since the mirena was inserted her mood changes are more frequent, not lasting as long. She is sexually active, no pain. She reports an abnormal pap last year, ASCUS, negative hpv. Desires a pap today. She reports a h/o bilateral breast fibroadenomas and a new lesion noted on recent breast imaging. She was told she could have her new lesion removed or continue with 6 month mammograms. She would like referral to a breast surgeon.  She c/o constipation, can go over a week at a time without a BM. Usually has a BM every couple of days. This has been a long term problem for her.    No LMP recorded (lmp unknown).          Sexually active: Yes.    The current method of family planning is IUD.    Exercising: No.  The patient does not participate in regular exercise at present.  Smokes pack or less a day.  Health Maintenance: Pap:  04/06/14 abnormal History of abnormal Pap:  Yes, ASCUS, negative hpv MMG:  03/14/15 Dense C B Colonoscopy:  n/a BMD:  n/a TDaP:  n/a Screening Labs: PCP, Hb today: 12.5, Urine today:neg    reports that she has been smoking Cigarettes.  She has a 5 pack-year smoking history. She has never used smokeless tobacco. She reports that she drinks about 1.2 oz of alcohol per week. She reports that she does not use illicit drugs.  Past Medical History  Diagnosis Date  . Anemia   . Anxiety   . Hormone disorder   . History of recurrent UTIs   . Interstitial cystitis     Past Surgical History  Procedure Laterality Date  . Cesarean section  x2  . Ovarian cyst removal  Age 39    Current Outpatient Prescriptions  Medication Sig Dispense  Refill  . ALPRAZolam (XANAX) 0.25 MG tablet Take 0.25 mg by mouth as needed for anxiety. Breaks in half.    Marland Kitchen FLUoxetine (PROZAC) 20 MG capsule Take 1 capsule (20 mg total) by mouth daily. 30 capsule 11  . traMADol (ULTRAM) 50 MG tablet Take 1 tablet (50 mg total) by mouth every 6 (six) hours as needed. 30 tablet 1  . oxyCODONE-acetaminophen (PERCOCET/ROXICET) 5-325 MG per tablet Take 1 tablet by mouth as needed.     No current facility-administered medications for this visit.    Family History  Problem Relation Age of Onset  . Endometriosis Sister   . Endometriosis Mother   . Endometriosis Maternal Aunt   . Endometriosis Maternal Aunt   . Heart attack Paternal Grandmother   . Stroke Mother   . Hypertension Father   . Hypertension Sister   . Hypertension Paternal Grandmother   . Uterine cancer Other     paternal great GM    ROS:  Pertinent items are noted in HPI.  Otherwise, a comprehensive ROS was negative.  Exam:   BP 112/72 mmHg  Pulse 82  Resp 16  Ht 5\' 1"  (1.549 m)  Wt 145 lb 8 oz (65.998 kg)  BMI 27.51 kg/m2  LMP  (LMP Unknown)  Weight change: @WEIGHTCHANGE @ Height:   Height:  5\' 1"  (154.9 cm)  Ht Readings from Last 3 Encounters:  05/13/15 5\' 1"  (1.549 m)  02/27/15 5' 1.75" (1.568 m)  11/16/14 5' 1.75" (1.568 m)    General appearance: alert, cooperative and appears stated age Head: Normocephalic, without obvious abnormality, atraumatic Neck: no adenopathy, supple, symmetrical, trachea midline and thyroid normal to inspection and palpation Lungs: clear to auscultation bilaterally Breasts: Normal appearence, a small mobile lump is noted in the right breast at 2 o'clock approximately 6 cm from the areolar region. No other lumps are palpated.  Heart: regular rate and rhythm Abdomen: soft, non-tender; bowel sounds normal; no masses,  no organomegaly Extremities: extremities normal, atraumatic, no cyanosis or edema Skin: Skin color, texture, turgor normal. No rashes or  lesions Lymph nodes: Cervical, supraclavicular, and axillary nodes normal. No abnormal inguinal nodes palpated Neurologic: Grossly normal   Pelvic: External genitalia:  no lesions              Urethra:  normal appearing urethra with no masses, tenderness or lesions              Bartholins and Skenes: normal                 Vagina: normal appearing vagina with normal color and discharge, no lesions              Cervix: no lesions, IUD string 3 cm.               Pap taken: Yes.   Bimanual Exam:  Uterus:  normal size, contour, position, consistency, mobility, non-tender and anteverted              Adnexa: normal adnexa and no mass, fullness, tenderness               Rectovaginal: Confirms               Anus:  normal sphincter tone, no lesions  Chaperone was present for exam.  A:  Well Woman with normal exam Bilateral breast fibroadenomas, new lesion noted in the right breast during diagnostic imaging, not palpated on my exam Constipation H/O anemia IUD f/u, IUD in place, she continues to have light bleeding, reassured this is normal and can last for 3-6 months.  PMDD, she reports changes in her mood since the IUD was placed  P:  Mammogram just done, will refer to a breast specialist for a consultation (bilateral fibroadenoma, now with a new right breast density) Pap with reflex hpv Discussed constipation, handout from [facility]TD given CBC, TSH, lipids F/U with Dr Sabra Heck if bleeding doesn't improve over the next few months Calender bleeding and mood, f/u with Dr Sabra Heck if her mood is not improving

## 2015-05-14 LAB — CBC
HCT: 39.3 % (ref 36.0–46.0)
Hemoglobin: 12.8 g/dL (ref 12.0–15.0)
MCH: 25.7 pg — ABNORMAL LOW (ref 26.0–34.0)
MCHC: 32.6 g/dL (ref 30.0–36.0)
MCV: 78.9 fL (ref 78.0–100.0)
MPV: 9.9 fL (ref 8.6–12.4)
Platelets: 366 10*3/uL (ref 150–400)
RBC: 4.98 MIL/uL (ref 3.87–5.11)
RDW: 15.3 % (ref 11.5–15.5)
WBC: 8.2 10*3/uL (ref 4.0–10.5)

## 2015-05-14 LAB — LIPID PANEL
CHOLESTEROL: 187 mg/dL (ref 0–200)
HDL: 53 mg/dL (ref >=46)
LDL Cholesterol: 121 mg/dL — ABNORMAL HIGH (ref 0–99)
Total CHOL/HDL Ratio: 3.5 Ratio
Triglycerides: 64 mg/dL (ref <150)
VLDL: 13 mg/dL (ref 0–40)

## 2015-05-14 LAB — TSH: TSH: 1.284 u[IU]/mL (ref 0.350–4.500)

## 2015-05-15 ENCOUNTER — Ambulatory Visit: Payer: BC Managed Care – PPO | Admitting: Obstetrics and Gynecology

## 2015-05-15 LAB — IPS PAP TEST WITH REFLEX TO HPV

## 2015-07-09 ENCOUNTER — Telehealth: Payer: Self-pay | Admitting: Obstetrics and Gynecology

## 2015-07-09 NOTE — Telephone Encounter (Signed)
Left message on voicemail to call and reschedule cancelled appointment. °

## 2015-08-16 ENCOUNTER — Telehealth: Payer: Self-pay | Admitting: Obstetrics and Gynecology

## 2015-08-16 ENCOUNTER — Ambulatory Visit (INDEPENDENT_AMBULATORY_CARE_PROVIDER_SITE_OTHER): Payer: BC Managed Care – PPO | Admitting: Obstetrics and Gynecology

## 2015-08-16 ENCOUNTER — Encounter: Payer: Self-pay | Admitting: Obstetrics and Gynecology

## 2015-08-16 VITALS — BP 104/82 | HR 76 | Ht 61.0 in | Wt 144.2 lb

## 2015-08-16 DIAGNOSIS — N926 Irregular menstruation, unspecified: Secondary | ICD-10-CM

## 2015-08-16 LAB — POCT URINE PREGNANCY: PREG TEST UR: NEGATIVE

## 2015-08-16 NOTE — Telephone Encounter (Signed)
Patient states that last Friday 08/09/15 she had a very large(size of her fist) piece of vaginal discharge that she describes as clear, rubbery and thick with red streaks. On 08/10/15 she started having vaginal bleeding. She describes vaginal bleeding as heavy and states that she is changing her coverage every two hours but that they are soaked before the two hours is up. Denies dizziness, fatigue or lightheadedness. Patient states about a week before this she had an episode of breast pain that was single breast and then became bilateral breast pain. Patient states she can feel her Mirena IUD strings and states she feels like it is coming out. Reports intermittent midline "menstrual cramps."  Advised patient office visit indicated today for evaluation. Patient states she does not have a car and is in [place]. Advised patient that if she cannot come into office for evaluation then recommended Emergency department. Patient states she will drive to office as she does not want to go to emergency department. She states she feels well enough to drive and does not think it will be a problem. Discussed with nursing supervisor, Lamont Snowball, RN patient can come now for work in appointment with Dr. Quincy Simmonds.  Call to patient and she is agreeable.  Routing to provider for final review. Patient agreeable to disposition. Will close encounter.

## 2015-08-16 NOTE — Progress Notes (Signed)
Patient ID: Courtney Goodwin, female   DOB: Nov 22, 1975, 39 y.o.   MRN: [id number] GYNECOLOGY  VISIT   HPI: 39 y.o.   Married  Caucasian  female   G2P2002 with Patient's last menstrual period was 08/09/2015 (approximate).   here for evaluation of abnormal/heavy bleeding with Mirena IUD.  Patient states she passed very large piece of vaginal tissue  08-09-15 and began with vaginal bleeding.  Now she thinks IUD may be displaced.   Spotting with IUD. Passed a glob of rubbery material that was clear with a red dot through it one to two weeks ago. Patient is very perplexed about what this could be. Did not take a picture. Then had heavy bleeding.  Had cramping.   Not itching or burning.  Can feel the strings.   Was having breast tenderness which resolved.  Had urinary frequency which did not resolve.  Struggling with PMDD.   In marriage counseling.  On new medications.   Urine UPT:  Negative  GYNECOLOGIC HISTORY: Patient's last menstrual period was 08/09/2015 (approximate). Contraception:Mirena IUD placed 03-07-15 Menopausal hormone therapy: n/a Last mammogram: n/a Last pap smear: 05-13-15 Negative        OB History    Gravida Para Term Preterm AB TAB SAB Ectopic Multiple Living   2 2 2       2          Patient Active Problem List   Diagnosis Date Noted  . 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 PMDD (premenstrual dysphoric disorder) 01/10/2015  . Dysmenorrhea 01/10/2015  . DUB (dysfunctional uterine bleeding) 01/10/2015  . Menorrhagia with irregular cycle 01/10/2015    Past Medical History  Diagnosis Date  . Anemia   . Anxiety   . Hormone disorder   . History of recurrent UTIs   . Interstitial cystitis     Past Surgical History  Procedure Laterality Date  . Cesarean section  x2  . Ovarian cyst removal  Age 21    Current Outpatient Prescriptions  Medication Sig Dispense Refill  . clonazePAM (KLONOPIN) 0.5 MG tablet Take 1 tablet by mouth at bedtime.  0  . Vortioxetine  HBr (TRINTELLIX) 10 MG TABS Take 1 tablet by mouth at bedtime.     No current facility-administered medications for this visit.     ALLERGIES: Review of patient's allergies indicates no known allergies.  Family History  Problem Relation Age of Onset  . Endometriosis Sister   . Endometriosis Mother   . Endometriosis Maternal Aunt   . Endometriosis Maternal Aunt   . Heart attack Paternal Grandmother   . Stroke Mother   . Hypertension Father   . Hypertension Sister   . Hypertension Paternal Grandmother   . Uterine cancer Other     paternal great GM    Social History   Social History  . Marital Status: Married    Spouse Name: N/A  . Number of Children: N/A  . Years of Education: N/A   Occupational History  . Not on file.   Social History Main Topics  . Smoking status: Current Some Day Smoker -- 0.25 packs/day for 20 years    Types: Cigarettes  . Smokeless tobacco: Never Used     Comment: less than half a pack  . Alcohol Use: 1.2 oz/week    2 Standard drinks or equivalent per week     Comment: couple a week   . Drug Use: No  . Sexual Activity:    Partners: Male    Birth Control/  Protection: Surgical     Comment: Vasectomy/Mirena IUD inserted 03/2015   Other Topics Concern  . Not on file   Social History Narrative    ROS:  Pertinent items are noted in HPI.  PHYSICAL EXAMINATION:    BP 104/82 mmHg  Pulse 76  Ht 5\' 1"  (1.549 m)  Wt 144 lb 3.2 oz (65.409 kg)  BMI 27.26 kg/m2  LMP 08/09/2015 (Approximate)    General appearance: alert, cooperative and appears stated age   Pelvic: External genitalia:  no lesions              Urethra:  normal appearing urethra with no masses, tenderness or lesions              Bartholins and Skenes: normal                 Vagina: normal appearing vagina with normal color and discharge, no lesions              Cervix: no lesions and IUD strings seen.  No bleeding today.           Bimanual Exam:  Uterus:  normal size, contour,  position, consistency, mobility, non-tender              Adnexa: normal adnexa and no mass, fullness, tenderness                Chaperone was present for exam.  ASSESSMENT  Mirena IUD patient.  Irregular vaginal bleeding.  Passed tissue per vagina.  ? Early pregnancy loss? UPT negative here now. Marital stress.   PLAN  Counseled regarding Mirena IUD bleeding profile.  Counseled regarding signs and symptoms of early pregnancy and pregnancy loss.   Discussion of quantitative beta hCG testing and its usefulness even if had a recent miscarriage.  Level could still be positive.  Can offer ultrasound if pregnancy test is negative and bleeding persists.  This is very troublesome to patient.    An After Visit Summary was printed and given to the patient.  ___25___ minutes face to face time of which over 50% was spent in counseling.

## 2015-08-16 NOTE — Telephone Encounter (Addendum)
Patient is having a very heavy period with her mirena and she is needing someone to give her a call back. Best cotnact [phone] (no Chart)

## 2015-08-17 LAB — HCG, QUANTITATIVE, PREGNANCY: hCG, Beta Chain, Quant, S: <2 m[IU]/mL

## 2015-08-19 ENCOUNTER — Telehealth: Payer: Self-pay

## 2015-08-19 NOTE — Telephone Encounter (Signed)
-----   Message from Nunzio Cobbs, MD sent at 08/17/2015  1:35 PM EDT ----- Please report negative blood pregnancy test to patient.  We are not sure if she recently had a miscarriage or not.  (Has Mirena IUD.) If she continues to have bleeding, I think it is reasonable to do an ultrasound. I think she will be interested to do this.  It may be done in office.  An order will need to be placed.    Cc- Marisa Sprinkles

## 2015-08-19 NOTE — Telephone Encounter (Signed)
Left message to call Kaitlyn at [phone]. 

## 2015-08-19 NOTE — Telephone Encounter (Signed)
Spoke with patient. Advised of message as seen below from [place]. Patient is agreeable. Patient states she is still having irregular bleeding. Would like to wait a little longer to see how she does. If bleeding continues she will return call to schedule an appointment for a PUS with Dr.Silva.   Routing to provider for final review. Patient agreeable to disposition. Will close encounter.

## 2016-03-12 ENCOUNTER — Telehealth: Payer: Self-pay | Admitting: *Deleted

## 2016-03-12 DIAGNOSIS — D242 Benign neoplasm of left breast: Secondary | ICD-10-CM

## 2016-03-12 NOTE — Telephone Encounter (Signed)
Left Message To Call Back  

## 2016-03-12 NOTE — Telephone Encounter (Signed)
Patient is past due for recommended breast imaging due 09/2015  Last MMG and Results: 03/18/15 Bilateral Diagnostic Mammogram and Ultrasound  Previously imaged bilateral stable fibroadenomas and new probable fibroadenoma in left breast   Recommendation: Left breast ultrasound in 6 months to follow-up the probable fibroadenoma  Patient has not scheduled or completed recommended breast imaging due 09/2015 at [facility] per [place].  Please call patient to help schedule. (Imaging phone number [phone])

## 2016-04-13 NOTE — Telephone Encounter (Signed)
Left voicemail for pt to call back re: MMG.

## 2016-04-20 ENCOUNTER — Telehealth: Payer: Self-pay | Admitting: Nurse Practitioner

## 2016-04-20 NOTE — Telephone Encounter (Signed)
error 

## 2016-04-22 NOTE — Telephone Encounter (Signed)
[facility] made appt for 04/29/16 @10 :10am. Left voicemail for pt with appt info. Orders faxed.

## 2016-04-22 NOTE — Telephone Encounter (Signed)
Patient called back and she did not realize that she was suppose to have this done back in Nov. She though it was for this summer. She is requesting that we call and set this up

## 2016-04-27 NOTE — Telephone Encounter (Signed)
Recall extended to 04/2016. Closing encounter.

## 2016-05-14 ENCOUNTER — Telehealth: Payer: Self-pay | Admitting: Obstetrics and Gynecology

## 2016-05-14 ENCOUNTER — Encounter: Payer: Self-pay | Admitting: Obstetrics and Gynecology

## 2016-05-14 ENCOUNTER — Ambulatory Visit: Payer: Self-pay | Admitting: Obstetrics and Gynecology

## 2016-05-14 NOTE — Telephone Encounter (Signed)
Patient cancelled her appointment for today at 1:15. See staff message. She is aware of cancellation fee.

## 2016-05-15 ENCOUNTER — Ambulatory Visit: Payer: BC Managed Care – PPO | Admitting: Obstetrics and Gynecology

## 2016-06-01 ENCOUNTER — Encounter: Payer: Self-pay | Admitting: Obstetrics and Gynecology

## 2016-06-01 ENCOUNTER — Ambulatory Visit (INDEPENDENT_AMBULATORY_CARE_PROVIDER_SITE_OTHER): Payer: BC Managed Care – PPO | Admitting: Obstetrics and Gynecology

## 2016-06-01 VITALS — BP 120/82 | HR 76 | Resp 14 | Ht 61.25 in | Wt 132.0 lb

## 2016-06-01 DIAGNOSIS — Z01419 Encounter for gynecological examination (general) (routine) without abnormal findings: Secondary | ICD-10-CM

## 2016-06-01 DIAGNOSIS — Z113 Encounter for screening for infections with a predominantly sexual mode of transmission: Secondary | ICD-10-CM

## 2016-06-01 DIAGNOSIS — L292 Pruritus vulvae: Secondary | ICD-10-CM | POA: Diagnosis not present

## 2016-06-01 DIAGNOSIS — Z0000 Encounter for general adult medical examination without abnormal findings: Secondary | ICD-10-CM | POA: Diagnosis not present

## 2016-06-01 DIAGNOSIS — Z30431 Encounter for routine checking of intrauterine contraceptive device: Secondary | ICD-10-CM

## 2016-06-01 NOTE — Progress Notes (Signed)
40 y.o. [id number] MarriedCaucasianF here for annual exam.  She has a mirena IUD, placed in 2016.  Period Cycle (Days): 28 Period Duration (Days): 5 days  Period Pattern: Regular Menstrual Flow: Light, Moderate Menstrual Control: Tampon, Maxi pad, Thin pad Dysmenorrhea: None  She can saturate a regular tampon in 4 hours. Lighter then prior to the IUD. Minimal cramps prior to her cycle. One episode of BTB.  She has a h/o PMDD, it has gotten somewhat better. She has changed her diet, feels it has helped her mood. She has lost 30 + lbs. Depression is under control at the moment.  Sexually active, on occasion. No pain.  Kids are almost 12 and 6.  Since she was a teenager she has gotten rashes from shaving.  Every month right before her cycle she gets vulvar itching. Has been going on for years.    Patient's last menstrual period was 05/31/2016 (exact date).          Sexually active: Yes.    The current method of family planning is vasectomy.    Exercising: No.  The patient does not participate in regular exercise at present. Smoker:  yes  Health Maintenance: Pap:  05-13-15 WNL History of abnormal Pap:  Yes -years ago MMG:  05/2016 WNL per patient [facility]  Colonoscopy:  Never BMD:   Never TDaP:  Unsure  Gardasil: N/A   reports that she has been smoking Cigarettes.  She has a 5.00 pack-year smoking history. She has never used smokeless tobacco. She reports that she drinks about 1.2 oz of alcohol per week . She reports that she does not use drugs.She is smoking 1/2 a PPD, wants to quit.  She is moving to [place] in a few weeks for husbands new job. She has 2 teaching offers.  Past Medical History:  Diagnosis Date  . Anemia   . Anxiety   . History of recurrent UTIs   . Hormone disorder   . Interstitial cystitis     Past Surgical History:  Procedure Laterality Date  . CESAREAN SECTION  x2  . INTRAUTERINE DEVICE (IUD) INSERTION  03/2015  . OVARIAN CYST REMOVAL  Age 72     Current Outpatient Prescriptions  Medication Sig Dispense Refill  . clonazePAM (KLONOPIN) 0.5 MG tablet Take 1 tablet by mouth at bedtime.  0   No current facility-administered medications for this visit.     Family History  Problem Relation Age of Onset  . Endometriosis Sister   . Endometriosis Mother   . Stroke Mother   . Endometriosis Maternal Aunt   . Endometriosis Maternal Aunt   . Heart attack Paternal Grandmother   . Hypertension Paternal Grandmother   . Hypertension Father   . Hypertension Sister   . Uterine cancer Other     paternal great GM    Review of Systems  Constitutional: Negative.   HENT: Negative.   Eyes: Negative.   Respiratory: Negative.   Cardiovascular: Negative.   Gastrointestinal: Negative.   Endocrine: Negative.   Genitourinary: Negative.   Musculoskeletal: Negative.   Skin: Negative.   Allergic/Immunologic: Negative.   Neurological: Negative.   Psychiatric/Behavioral: Negative.     Exam:   BP 120/82 (BP Location: Right Arm, Patient Position: Sitting, Cuff Size: Normal)   Pulse 76   Resp 14   Ht 5' 1.25" (1.556 m)   Wt 132 lb (59.9 kg)   LMP 05/31/2016 (Exact Date)   BMI 24.74 kg/m   Weight change: @WEIGHTCHANGE @ Height:  Height: 5' 1.25" (155.6 cm)  Ht Readings from Last 3 Encounters:  06/01/16 5' 1.25" (1.556 m)  08/16/15 5\' 1"  (1.549 m)  05/13/15 5\' 1"  (1.549 m)    General appearance: alert, cooperative and appears stated age Head: Normocephalic, without obvious abnormality, atraumatic Neck: no adenopathy, supple, symmetrical, trachea midline and thyroid normal to inspection and palpation Lungs: clear to auscultation bilaterally Breasts: normal appearance, no masses or tenderness, bilateral fibrocystic changes Heart: regular rate and rhythm Abdomen: soft, non-tender; bowel sounds normal; no masses,  no organomegaly Extremities: extremities normal, atraumatic, no cyanosis or edema Skin: Skin color, texture, turgor normal.  No rashes or lesions Lymph nodes: Cervical, supraclavicular, and axillary nodes normal. No abnormal inguinal nodes palpated Neurologic: Grossly normal   Pelvic: External genitalia:  no lesions              Urethra:  normal appearing urethra with no masses, tenderness or lesions              Bartholins and Skenes: normal                 Vagina: normal appearing vagina with normal color and discharge, no lesions              Cervix: no lesions, IUD string 3 cm               Bimanual Exam:  Uterus:  normal size, contour, position, consistency, mobility, non-tender              Adnexa: no mass, fullness, tenderness               Rectovaginal: Confirms               Anus:  normal sphincter tone, no lesions  Chaperone was present for exam.  A:  Well Woman with normal exam  Screening for STD  Cyclic vulvar pruritus  IUD check, IUD in place  P:   No pap this year  Routine blood work, desires TSH  Send wet prep probe  Discussed breast self exam  Discussed calcium and vit D intake

## 2016-06-01 NOTE — Patient Instructions (Signed)

## 2016-06-02 ENCOUNTER — Encounter: Payer: Self-pay | Admitting: Obstetrics and Gynecology

## 2016-06-02 LAB — CBC
HEMATOCRIT: 44.9 % (ref 35.0–45.0)
Hemoglobin: 15 g/dL (ref 11.7–15.5)
MCH: 30.9 pg (ref 27.0–33.0)
MCHC: 33.4 g/dL (ref 32.0–36.0)
MCV: 92.4 fL (ref 80.0–100.0)
MPV: 10.1 fL (ref 7.5–12.5)
PLATELETS: 321 10*3/uL (ref 140–400)
RBC: 4.86 MIL/uL (ref 3.80–5.10)
RDW: 12.2 % (ref 11.0–15.0)
WBC: 9.5 10*3/uL (ref 3.8–10.8)

## 2016-06-02 LAB — LIPID PANEL
CHOL/HDL RATIO: 3.5 ratio (ref <=5.0)
Cholesterol: 191 mg/dL (ref 125–200)
HDL: 54 mg/dL (ref >=46)
LDL CALC: 127 mg/dL (ref <130)
Triglycerides: 50 mg/dL (ref <150)
VLDL: 10 mg/dL (ref <30)

## 2016-06-02 LAB — WET PREP BY MOLECULAR PROBE
CANDIDA SPECIES: NEGATIVE
GARDNERELLA VAGINALIS: NEGATIVE
TRICHOMONAS VAG: NEGATIVE

## 2016-06-02 LAB — STD PANEL
HIV 1&2 Ab, 4th Generation: NONREACTIVE
Hepatitis B Surface Ag: NEGATIVE

## 2016-06-02 LAB — COMPREHENSIVE METABOLIC PANEL
ALK PHOS: 52 U/L (ref 33–115)
ALT: 9 U/L (ref 6–29)
AST: 15 U/L (ref 10–30)
Albumin: 4.2 g/dL (ref 3.6–5.1)
BUN: 9 mg/dL (ref 7–25)
CALCIUM: 9.8 mg/dL (ref 8.6–10.2)
CHLORIDE: 105 mmol/L (ref 98–110)
CO2: 25 mmol/L (ref 20–31)
Creat: 0.81 mg/dL (ref 0.50–1.10)
GLUCOSE: 85 mg/dL (ref 65–99)
POTASSIUM: 4 mmol/L (ref 3.5–5.3)
Sodium: 138 mmol/L (ref 135–146)
Total Bilirubin: 0.7 mg/dL (ref 0.2–1.2)
Total Protein: 6.3 g/dL (ref 6.1–8.1)

## 2016-06-02 LAB — GC/CHLAMYDIA PROBE AMP
CT PROBE, AMP APTIMA: NOT DETECTED
GC PROBE AMP APTIMA: NOT DETECTED

## 2016-06-02 LAB — VITAMIN D 25 HYDROXY (VIT D DEFICIENCY, FRACTURES): VIT D 25 HYDROXY: 44 ng/mL (ref 30–100)

## 2016-06-02 LAB — TSH: TSH: 1.7 m[IU]/L

## 2016-06-02 LAB — HEPATITIS C ANTIBODY: HCV Ab: NEGATIVE

## 2016-07-16 ENCOUNTER — Telehealth: Payer: Self-pay | Admitting: Obstetrics and Gynecology

## 2016-07-16 NOTE — Telephone Encounter (Signed)
Please see what is going on with the patient. Is she depressed, anxious? If so she is better off with a SSRI.  I get nervous with long term Klonopin use because it can be habit forming. In the past she was given some Klonipin to sleep. If that is her only issue, you can refill Klonopin 0.5 mg, 1 tab po qhs prn. #30, no refills. If she is having long term sleep issues she should establish care with a primary MD, there are other options which aren't potentially habit forming.

## 2016-07-16 NOTE — Telephone Encounter (Signed)
Patient returned call. Patient states that she was here for her AEX back in July and meant to ask for a new prescription of the klonopin then. Patient states she and her family have recently moved to [place], [place] and "I'm just not doing too well." She states she rarely takes her prescription, but " I am in a moment now that I have to take it." Patient unable to come in to office due to the fact that she has moved, but she plans to come to town for her AEX's. RN advised this message would be sent to Dr. Talbert Nan for review and our office would call with any additional recommendations. Patient aware Dr. Talbert Nan is seeing patient's today, so a response might not be immediate. Patient requests new prescription be sent to [facility] on [address] in [place], [place] [address].    Routing to provider for review.

## 2016-07-16 NOTE — Telephone Encounter (Signed)
Message left to return call to Triage Nurse at [phone].    

## 2016-07-16 NOTE — Telephone Encounter (Signed)
Patient called and left a message on the answering machine at lunch. She said, "I am in desperate need of a clonazepam refill. I can barely function right now. I haven't gotten a new prescription in awhile because I don't use it that often."  Routing to triage to follow up with patient. No pharmacy information left.

## 2016-07-17 NOTE — Telephone Encounter (Signed)
Left message to call triage RN @ [phone].

## 2016-07-29 NOTE — Telephone Encounter (Signed)
Left message to return call to [place] at [phone].

## 2016-07-30 NOTE — Telephone Encounter (Signed)
I will close the encounter. She will call back if she wants to f/u

## 2016-07-30 NOTE — Telephone Encounter (Signed)
Dr. Talbert Nan, I have attempted to reach patient 2 times with no return call, please advise?

## 2016-08-10 DIAGNOSIS — F419 Anxiety disorder, unspecified: Secondary | ICD-10-CM | POA: Insufficient documentation

## 2016-08-10 DIAGNOSIS — E282 Polycystic ovarian syndrome: Secondary | ICD-10-CM

## 2016-08-10 DIAGNOSIS — N3010 Interstitial cystitis (chronic) without hematuria: Secondary | ICD-10-CM

## 2016-08-10 HISTORY — DX: Polycystic ovarian syndrome: E28.2

## 2016-08-10 HISTORY — DX: Anxiety disorder, unspecified: F41.9

## 2016-08-10 HISTORY — DX: Interstitial cystitis (chronic) without hematuria: N30.10

## 2016-08-23 DIAGNOSIS — D3610 Benign neoplasm of peripheral nerves and autonomic nervous system, unspecified: Secondary | ICD-10-CM

## 2016-08-23 HISTORY — DX: Benign neoplasm of peripheral nerves and autonomic nervous system, unspecified: D36.10

## 2017-06-02 ENCOUNTER — Ambulatory Visit (INDEPENDENT_AMBULATORY_CARE_PROVIDER_SITE_OTHER): Payer: 59 | Admitting: Obstetrics and Gynecology

## 2017-06-02 ENCOUNTER — Encounter: Payer: Self-pay | Admitting: Obstetrics and Gynecology

## 2017-06-02 ENCOUNTER — Other Ambulatory Visit (HOSPITAL_COMMUNITY)
Admission: RE | Admit: 2017-06-02 | Discharge: 2017-06-02 | Disposition: A | Discharge status: Home / Self Care | Payer: 59 | Source: Ambulatory Visit | Attending: Obstetrics and Gynecology | Admitting: Obstetrics and Gynecology

## 2017-06-02 ENCOUNTER — Other Ambulatory Visit: Payer: Self-pay | Admitting: Obstetrics and Gynecology

## 2017-06-02 ENCOUNTER — Telehealth: Payer: Self-pay | Admitting: Obstetrics and Gynecology

## 2017-06-02 VITALS — BP 118/70 | HR 68 | Resp 14 | Ht 61.0 in | Wt 142.0 lb

## 2017-06-02 DIAGNOSIS — Z113 Encounter for screening for infections with a predominantly sexual mode of transmission: Secondary | ICD-10-CM | POA: Insufficient documentation

## 2017-06-02 DIAGNOSIS — N898 Other specified noninflammatory disorders of vagina: Secondary | ICD-10-CM

## 2017-06-02 DIAGNOSIS — L732 Hidradenitis suppurativa: Secondary | ICD-10-CM

## 2017-06-02 DIAGNOSIS — Z23 Encounter for immunization: Secondary | ICD-10-CM | POA: Diagnosis not present

## 2017-06-02 DIAGNOSIS — Z01419 Encounter for gynecological examination (general) (routine) without abnormal findings: Secondary | ICD-10-CM

## 2017-06-02 DIAGNOSIS — Z124 Encounter for screening for malignant neoplasm of cervix: Secondary | ICD-10-CM | POA: Diagnosis not present

## 2017-06-02 DIAGNOSIS — R102 Pelvic and perineal pain unspecified side: Secondary | ICD-10-CM

## 2017-06-02 DIAGNOSIS — D249 Benign neoplasm of unspecified breast: Secondary | ICD-10-CM

## 2017-06-02 DIAGNOSIS — Z0000 Encounter for general adult medical examination without abnormal findings: Secondary | ICD-10-CM | POA: Diagnosis not present

## 2017-06-02 DIAGNOSIS — R350 Frequency of micturition: Secondary | ICD-10-CM

## 2017-06-02 MED ORDER — DOXYCYCLINE HYCLATE 100 MG PO CAPS: 100.0000 mg | ORAL_CAPSULE | Freq: Two times a day (BID) | ORAL | 0 refills | Status: DC

## 2017-06-02 NOTE — Progress Notes (Signed)
41 y.o. [id number] MarriedCaucasianF here for annual exam.  She has a mirena, placed in 5/16. Prior to the IUD she had menorrhagia with irregular cycles. She recently stopped having cycles. LMP about 2 months ago. She c/o intermittent pelvic cramping in the last month, it was really bad last night. So far she feels better today. The pain can last all day, can range from a 5-8/10 in severity. Yesterday she had nausea, no emesis. She c/o intermittent vaginal d/c and some mild discomfort.  Desires full STD check, having marital issues. She has issues with bilateral breast fibroadenomas. She is overdue for imaging, frustrated that it always returns needing more imaging.  She has issues with boils with shaving in the pubic region, currently has 2 boils.  She has a h/o IC, recently having urinary frequency and urgency as well.    No LMP recorded. Patient is not currently having periods (Reason: IUD).          Sexually active: Yes.    The current method of family planning is IUD Mirena.    Exercising: No.  The patient does not participate in regular exercise at present. Smoker:  yes  Health Maintenance: Pap:  05-13-15 WNL 04-16-14 ASCUS  NEG HR HPV  History of abnormal Pap:  Yes 2015   MMG: 04-29-16- need follow up DX MMG 04/2017- patient has not scheduled Colonoscopy:  never BMD:   Never TDaP:  Unsure, due Gardasil: N/A   reports that she has been smoking Cigarettes.  She has a 5.00 pack-year smoking history. She has never used smokeless tobacco. She reports that she drinks about 1.8 - 2.4 oz of alcohol per week . She reports that she does not use drugs.Kids are almost 13 and 7.   Past Medical History:  Diagnosis Date  . Anemia   . Anxiety   . History of recurrent UTIs   . Hormone disorder   . Interstitial cystitis     Past Surgical History:  Procedure Laterality Date  . CESAREAN SECTION  x2  . INTRAUTERINE DEVICE (IUD) INSERTION  03/2015  . OVARIAN CYST REMOVAL  Age 80    Current  Outpatient Prescriptions  Medication Sig Dispense Refill  . levonorgestrel (MIRENA) 20 MCG/24HR IUD 1 each by Intrauterine route once.     No current facility-administered medications for this visit.     Family History  Problem Relation Age of Onset  . Endometriosis Sister   . Endometriosis Mother   . Stroke Mother   . Endometriosis Maternal Aunt   . Endometriosis Maternal Aunt   . Heart attack Paternal Grandmother   . Hypertension Paternal Grandmother   . Hypertension Father   . Hypertension Sister   . Uterine cancer Other        paternal great GM    Review of Systems  Constitutional: Negative.   HENT: Negative.   Eyes: Negative.   Respiratory: Negative.   Cardiovascular: Negative.   Gastrointestinal: Negative.   Endocrine: Negative.   Genitourinary: Negative.        Ingrown hairs in pubic area  Musculoskeletal: Negative.   Skin: Negative.   Allergic/Immunologic: Negative.   Neurological: Negative.   Psychiatric/Behavioral: Negative.     Exam:   BP 118/70 (BP Location: Right Arm, Patient Position: Sitting, Cuff Size: Normal)   Pulse 68   Resp 14   Ht 5\' 1"  (1.549 m)   Wt 142 lb (64.4 kg)   BMI 26.83 kg/m   Weight change: @WEIGHTCHANGE @ Height:   Height:  5\' 1"  (154.9 cm)  Ht Readings from Last 3 Encounters:  06/02/17 5\' 1"  (1.549 m)  06/01/16 5' 1.25" (1.556 m)  08/16/15 5\' 1"  (1.549 m)    General appearance: alert, cooperative and appears stated age Head: Normocephalic, without obvious abnormality, atraumatic Neck: no adenopathy, supple, symmetrical, trachea midline and thyroid normal to inspection and palpation Lungs: clear to auscultation bilaterally Cardiovascular: regular rate and rhythm Breasts: She has a h/o 3 breast lumps, all felt today on exam. All lumps are smooth and mobile and < 1cm. One in the right breast at 2 o'clock, one in the left breast at 7 and one in the left breast at 10 o'clock. No skin changes Abdomen: soft, non-tender; bowel sounds  normal; no masses,  no organomegaly Extremities: extremities normal, atraumatic, no cyanosis or edema Skin: Skin color, texture, turgor normal. No rashes or lesions Lymph nodes: Cervical, supraclavicular, and axillary nodes normal. No abnormal inguinal nodes palpated Neurologic: Grossly normal   Pelvic: External genitalia:  3 boils on the mons noted, the largest is under 2 cm, but very tender.               Urethra:  normal appearing urethra with no masses, tenderness or lesions              Bartholins and Skenes: normal                 Vagina: normal appearing vagina with normal color and discharge, no lesions              Cervix: no lesions and IUD string 4 cm, no CMT               Bimanual Exam:  Uterus:  anteverted, mobile, mildly tender.               Adnexa: no mass, fullness, tenderness               Rectovaginal: Confirms               Anus:  normal sphincter tone, no lesions  Bladder tender to palpation  Chaperone was present for exam.  A:  Well Woman exam  Vaginal discharge  Urinary frequency and urgency (h/o IC)  Intermittent pelvic pain for one month, uterus tender on exam  IUD check, in place  Breast lumps stable, due for diagnostic imaging (will change to the breast center)  Boils  P:   Pap with hpv  STD testing  Affirm  Urine for ua, c&s  Set up diagnostic breast imaging  Screening labs  Will treat with doxycycline for 10 days for possible endometritis and for boils  Recommend hot compresses and warm soaks for the boils.   F/U in 2 weeks    In addition to the annual exam, over 15 minutes was spent face to face with multiple medical issues, over 50% in counseling.

## 2017-06-02 NOTE — Telephone Encounter (Signed)
Please call patient to schedule a 2 week recheck appointment with Dr Talbert Nan because there were no available slots around the time she could come in.

## 2017-06-03 LAB — CBC
HEMOGLOBIN: 15.1 g/dL (ref 11.1–15.9)
Hematocrit: 43.5 % (ref 34.0–46.6)
MCH: 30.8 pg (ref 26.6–33.0)
MCHC: 34.7 g/dL (ref 31.5–35.7)
MCV: 89 fL (ref 79–97)
Platelets: 330 10*3/uL (ref 150–379)
RBC: 4.91 x10E6/uL (ref 3.77–5.28)
RDW: 13 % (ref 12.3–15.4)
WBC: 6.8 10*3/uL (ref 3.4–10.8)

## 2017-06-03 LAB — URINALYSIS, MICROSCOPIC ONLY
Casts: NONE SEEN /lpf
RBC, UA: NONE SEEN /hpf (ref 0–?)

## 2017-06-03 LAB — LIPID PANEL
CHOLESTEROL TOTAL: 180 mg/dL (ref 100–199)
Chol/HDL Ratio: 3.6 ratio (ref 0.0–4.4)
HDL: 50 mg/dL (ref >39)
LDL Calculated: 119 mg/dL — ABNORMAL HIGH (ref 0–99)
Triglycerides: 53 mg/dL (ref 0–149)
VLDL CHOLESTEROL CAL: 11 mg/dL (ref 5–40)

## 2017-06-03 LAB — HEP, RPR, HIV PANEL
HEP B S AG: NEGATIVE
HIV SCREEN 4TH GENERATION: NONREACTIVE
RPR Ser Ql: NONREACTIVE

## 2017-06-03 LAB — COMPREHENSIVE METABOLIC PANEL
ALBUMIN: 4.6 g/dL (ref 3.5–5.5)
ALK PHOS: 61 IU/L (ref 39–117)
ALT: 13 IU/L (ref 0–32)
AST: 19 IU/L (ref 0–40)
Albumin/Globulin Ratio: 2 (ref 1.2–2.2)
BILIRUBIN TOTAL: 0.7 mg/dL (ref 0.0–1.2)
BUN/Creatinine Ratio: 13 (ref 9–23)
BUN: 12 mg/dL (ref 6–20)
CO2: 21 mmol/L (ref 20–29)
Calcium: 10 mg/dL (ref 8.7–10.2)
Chloride: 103 mmol/L (ref 96–106)
Creatinine, Ser: 0.92 mg/dL (ref 0.57–1.00)
GFR calc Af Amer: 91 mL/min/{1.73_m2} (ref >59)
GFR, EST NON AFRICAN AMERICAN: 79 mL/min/{1.73_m2} (ref >59)
Globulin, Total: 2.3 g/dL (ref 1.5–4.5)
Glucose: 89 mg/dL (ref 65–99)
Potassium: 4.4 mmol/L (ref 3.5–5.2)
Sodium: 140 mmol/L (ref 134–144)
TOTAL PROTEIN: 6.9 g/dL (ref 6.0–8.5)

## 2017-06-03 LAB — VAGINITIS/VAGINOSIS, DNA PROBE
Candida Species: NEGATIVE
GARDNERELLA VAGINALIS: NEGATIVE
TRICHOMONAS VAG: NEGATIVE

## 2017-06-03 LAB — URINE CULTURE: Organism ID, Bacteria: NO GROWTH

## 2017-06-03 LAB — HEPATITIS C ANTIBODY

## 2017-06-03 NOTE — Telephone Encounter (Signed)
Made 2 week f/u for 06-17-17 1:30pm with Dr.Jertson.

## 2017-06-03 NOTE — Progress Notes (Signed)
Patient scheduled for Diagnostic mammogram 06-08-14 1:40pm at [facility].

## 2017-06-04 LAB — CYTOLOGY - PAP
CHLAMYDIA, DNA PROBE: NEGATIVE
DIAGNOSIS: NEGATIVE
HPV (WINDOPATH): NOT DETECTED
Neisseria Gonorrhea: NEGATIVE

## 2017-06-08 ENCOUNTER — Other Ambulatory Visit: Payer: Self-pay

## 2017-06-10 ENCOUNTER — Telehealth: Payer: Self-pay

## 2017-06-10 NOTE — Telephone Encounter (Signed)
Medication refill request: pt asking for alternative to doxycyline Last AEX:  06-02-17 Next AEX: 06-09-18 Last MMG (if hormonal medication request): n/a Refill authorized: pt was given rx for doxycline 100mg  twice daily for 10 days on 06-02-17. Pharmacy sent request back stating that patient is requesting an alternative due to cost. Please advise.

## 2017-06-10 NOTE — Telephone Encounter (Signed)
An alternative would be Azithromycin 500 mg po x 1 day and then 250 mg po q day, days 2 - 5.  Dispense:  250 mg, 7 tabs,  RF none.   This will not work as well to treat the boils on her skin.   Cc- Dr. Talbert Nan

## 2017-06-10 NOTE — Telephone Encounter (Signed)
Left message to call back  

## 2017-06-11 NOTE — Telephone Encounter (Signed)
I spoke to patient to let her know we will call in an alternative to the Doxycycline for her. Advised per Dr. Quincy Simmonds it will not work as well for the skin boils. Patient states if it will not work as well she does not want a different medication. She will wait until she gets paid to pick up Doxycycline.  I spoke to [facility] at [facility] and advised patient does not want alternative and to please put Doxycycline on hold until patient requests it to be filled.  Routing to provider for final review. Closing encounter.  CC: Dr. Talbert Nan

## 2017-06-11 NOTE — Telephone Encounter (Signed)
Left message to call back  

## 2017-06-15 ENCOUNTER — Encounter: Payer: Self-pay | Admitting: Obstetrics and Gynecology

## 2017-06-15 ENCOUNTER — Telehealth: Payer: Self-pay | Admitting: Obstetrics and Gynecology

## 2017-06-15 NOTE — Telephone Encounter (Signed)
Patient canceled her upcoming two week recheck appointment 06/17/17 via automated reminder call. I was unable to leave a message for patient to reschedule. I mailed a letter asking the patient to call and reschedule.

## 2017-06-17 ENCOUNTER — Ambulatory Visit: Payer: Self-pay | Admitting: Obstetrics and Gynecology

## 2017-07-01 NOTE — Telephone Encounter (Signed)
Will close encounter

## 2017-07-01 NOTE — Telephone Encounter (Signed)
Second attempted to reach patient by phone to reschedule canceled 2 week recheck from 06/17/17.  Letter mailed to patient 06/17/17. Okay to close encounter?

## 2017-07-27 ENCOUNTER — Encounter: Payer: Self-pay | Admitting: *Deleted

## 2017-07-27 ENCOUNTER — Telehealth: Payer: Self-pay | Admitting: *Deleted

## 2017-07-27 NOTE — Telephone Encounter (Signed)
Patient has been on 04 recall since June. We spoke with her at her 06-02-17 visit and put in orders for follow up imaging. Patient has still not gone for follow up imaging. Please advise on recall/letter

## 2017-07-27 NOTE — Telephone Encounter (Signed)
Letter sent-eh

## 2017-07-27 NOTE — Telephone Encounter (Signed)
Please send a letter

## 2017-12-03 DIAGNOSIS — R61 Generalized hyperhidrosis: Secondary | ICD-10-CM

## 2017-12-03 HISTORY — DX: Generalized hyperhidrosis: R61

## 2018-06-08 NOTE — Progress Notes (Deleted)
42 y.o. [id number] MarriedCaucasianF here for annual exam.      No LMP recorded. (Menstrual status: IUD).          Sexually active: {yes no:314532}  The current method of family planning is {contraception:315051}.    Exercising: {yes no:314532}  {types:19826} Smoker:  {YES [id number]  Health Maintenance: Pap: 06/02/2017 pap neg HR HPV neg, 05-13-15 WNL 04-16-14 ASCUS  NEG HR HPV  History of abnormal Pap:  Yes 2015   MMG: 04-29-16- need follow up DX MMG 04/2017- patient has not scheduled Colonoscopy:  never BMD:   Never TDaP:  06/02/2017 Gardasil:     reports that she has been smoking cigarettes.  She has a 5.00 pack-year smoking history. She has never used smokeless tobacco. She reports that she drinks about 1.8 - 2.4 oz of alcohol per week. She reports that she does not use drugs.  Past Medical History:  Diagnosis Date  . Anemia   . Anxiety   . History of recurrent UTIs   . Hormone disorder   . Interstitial cystitis     Past Surgical History:  Procedure Laterality Date  . CESAREAN SECTION  x2  . INTRAUTERINE DEVICE (IUD) INSERTION  03/2015  . OVARIAN CYST REMOVAL  Age 71    Current Outpatient Medications  Medication Sig Dispense Refill  . doxycycline (VIBRAMYCIN) 100 MG capsule Take 1 capsule (100 mg total) by mouth 2 (two) times daily. Take BID for 10 days.  Take with food as can cause GI distress. 20 capsule 0  . levonorgestrel (MIRENA) 20 MCG/24HR IUD 1 each by Intrauterine route once.     No current facility-administered medications for this visit.     Family History  Problem Relation Age of Onset  . Endometriosis Sister   . Endometriosis Mother   . Stroke Mother   . Endometriosis Maternal Aunt   . Endometriosis Maternal Aunt   . Heart attack Paternal Grandmother   . Hypertension Paternal Grandmother   . Hypertension Father   . Hypertension Sister   . Uterine cancer Other        paternal great GM    Review of Systems  Exam:   There were no vitals taken for  this visit.  Weight change: @WEIGHTCHANGE @ Height:      Ht Readings from Last 3 Encounters:  06/02/17 5\' 1"  (1.549 m)  06/01/16 5' 1.25" (1.556 m)  08/16/15 5\' 1"  (1.549 m)    General appearance: alert, cooperative and appears stated age Head: Normocephalic, without obvious abnormality, atraumatic Neck: no adenopathy, supple, symmetrical, trachea midline and thyroid {CHL AMB PHY EX THYROID NORM DEFAULT:[phone]::"normal to inspection and palpation"} Lungs: clear to auscultation bilaterally Cardiovascular: regular rate and rhythm Breasts: {Exam; breast:13139::"normal appearance, no masses or tenderness"} Abdomen: soft, non-tender; non distended,  no masses,  no organomegaly Extremities: extremities normal, atraumatic, no cyanosis or edema Skin: Skin color, texture, turgor normal. No rashes or lesions Lymph nodes: Cervical, supraclavicular, and axillary nodes normal. No abnormal inguinal nodes palpated Neurologic: Grossly normal   Pelvic: External genitalia:  no lesions              Urethra:  normal appearing urethra with no masses, tenderness or lesions              Bartholins and Skenes: normal                 Vagina: normal appearing vagina with normal color and discharge, no lesions  Cervix: {CHL AMB PHY EX CERVIX NORM DEFAULT:[phone]::"no lesions"}               Bimanual Exam:  Uterus:  {CHL AMB PHY EX UTERUS NORM DEFAULT:[id number]::"normal size, contour, position, consistency, mobility, non-tender"}              Adnexa: {CHL AMB PHY EX ADNEXA NO MASS DEFAULT:[phone]::"no mass, fullness, tenderness"}               Rectovaginal: Confirms               Anus:  normal sphincter tone, no lesions  Chaperone was present for exam.  A:  Well Woman with normal exam  P:

## 2018-06-09 ENCOUNTER — Ambulatory Visit: Payer: 59 | Admitting: Obstetrics and Gynecology

## 2018-06-09 ENCOUNTER — Encounter: Payer: Self-pay | Admitting: Obstetrics and Gynecology

## 2018-07-19 DIAGNOSIS — M542 Cervicalgia: Secondary | ICD-10-CM

## 2018-07-19 HISTORY — DX: Cervicalgia: M54.2

## 2018-08-04 DIAGNOSIS — R000 Tachycardia, unspecified: Secondary | ICD-10-CM

## 2018-08-04 DIAGNOSIS — R002 Palpitations: Secondary | ICD-10-CM

## 2018-08-04 DIAGNOSIS — I471 Supraventricular tachycardia: Secondary | ICD-10-CM

## 2018-08-04 DIAGNOSIS — R079 Chest pain, unspecified: Secondary | ICD-10-CM | POA: Insufficient documentation

## 2018-08-04 HISTORY — DX: Tachycardia, unspecified: R00.0

## 2018-08-04 HISTORY — DX: Chest pain, unspecified: R07.9

## 2018-08-04 HISTORY — DX: Supraventricular tachycardia: I47.1

## 2018-08-04 HISTORY — DX: Palpitations: R00.2

## 2018-08-13 NOTE — Progress Notes (Deleted)
Cardiology Office Note:    Date:  08/13/2018   ID:  Courtney Goodwin, DOB Jan 13, 1976, MRN [id number]  PCP:  Patient, No Pcp Per  Cardiologist:  Shirlee More, MD   Referring MD: Nicoletta Dress, MD  ASSESSMENT:    No diagnosis found. PLAN:    In order of problems listed above:  1. ***  Next appointment   Medication Adjustments/Labs and Tests Ordered: Current medicines are reviewed at length with the patient today.  Concerns regarding medicines are outlined above.  No orders of the defined types were placed in this encounter.  No orders of the defined types were placed in this encounter.    No chief complaint on file. ***  History of Present Illness:    Courtney Goodwin is a 42 y.o. female who is being seen today for the evaluation of supraventricular  tachycardia at the request of Nicoletta Dress, MD.   Past Medical History:  Diagnosis Date  . Anemia   . Anxiety   . Anxiety 08/10/2016  . 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 Chest pain 08/04/2018  . Chronic interstitial cystitis 08/10/2016  . Depressive disorder 07/17/2014  . DUB (dysfunctional uterine bleeding) 01/10/2015  . Dysmenorrhea 01/10/2015  . History of recurrent UTIs   . Hormone disorder   . Hyperhidrosis 12/03/2017  . Interstitial cystitis   . Menorrhagia with irregular cycle 01/10/2015  . Neck pain 07/19/2018  . Palpitations 08/04/2018  . Polycystic ovaries 08/10/2016  . Premenstrual dysphoric disorder 01/10/2015  . PSVT (paroxysmal supraventricular tachycardia) ([place]) 08/04/2018  . Tachycardia 08/04/2018    Past Surgical History:  Procedure Laterality Date  . CESAREAN SECTION  x2  . INTRAUTERINE DEVICE (IUD) INSERTION  03/2015  . OVARIAN CYST REMOVAL  Age 42    Current Medications: No outpatient medications have been marked as taking for the 08/15/18 encounter (Appointment) with Richardo Priest, MD.     Allergies:   Abilify [aripiprazole] and Sulfa antibiotics   Social History    Socioeconomic History  . Marital status: Married    Spouse name: Not on file  . Number of children: Not on file  . Years of education: Not on file  . Highest education level: Not on file  Occupational History  . Not on file  Social Needs  . Financial resource strain: Not on file  . Food insecurity:    Worry: Not on file    Inability: Not on file  . Transportation needs:    Medical: Not on file    Non-medical: Not on file  Tobacco Use  . Smoking status: Current Some Day Smoker    Packs/day: 0.25    Years: 20.00    Pack years: 5.00    Types: Cigarettes  . Smokeless tobacco: Never Used  . Tobacco comment: less than half a pack  Substance and Sexual Activity  . Alcohol use: Yes    Alcohol/week: 3.0 - 4.0 standard drinks    Types: 3 - 4 Standard drinks or equivalent per week  . Drug use: No  . Sexual activity: Yes    Partners: Male    Birth control/protection: Surgical, IUD    Comment: Vasectomy/Mirena IUD inserted 03/2015  Lifestyle  . Physical activity:    Days per week: Not on file    Minutes per session: Not on file  . Stress: Not on file  Relationships  . Social connections:    Talks on phone: Not on file    Gets together: Not on file  Attends religious service: Not on file    Active member of club or organization: Not on file    Attends meetings of clubs or organizations: Not on file    Relationship status: Not on file  Other Topics Concern  . Not on file  Social History Narrative  . Not on file     Family History: The patient's ***family history includes Endometriosis in her maternal aunt, maternal aunt, mother, and sister; Heart attack in her paternal grandmother; Hypertension in her father, paternal grandmother, and sister; Stroke in her mother; Uterine cancer in her other.  ROS:   ROS Please see the history of present illness.    *** All other systems reviewed and are negative.  EKGs/Labs/Other Studies Reviewed:    The following studies were  reviewed today: ***  EKG:  EKG is *** ordered today.  The ekg ordered today demonstrates ***  Recent Labs: No results found for requested labs within last 8760 hours.  Recent Lipid Panel    Component Value Date/Time   CHOL 180 06/02/2017 1637   TRIG 53 06/02/2017 1637   HDL 50 06/02/2017 1637   CHOLHDL 3.6 06/02/2017 1637   CHOLHDL 3.5 06/01/2016 1637   VLDL 10 06/01/2016 1637   LDLCALC 119 (H) 06/02/2017 1637    Physical Exam:    VS:  There were no vitals taken for this visit.    Wt Readings from Last 3 Encounters:  06/02/17 142 lb (64.4 kg)  06/01/16 132 lb (59.9 kg)  08/16/15 144 lb 3.2 oz (65.4 kg)     GEN: *** Well nourished, well developed in no acute distress HEENT: Normal NECK: No JVD; No carotid bruits LYMPHATICS: No lymphadenopathy CARDIAC: ***RRR, no murmurs, rubs, gallops RESPIRATORY:  Clear to auscultation without rales, wheezing or rhonchi  ABDOMEN: Soft, non-tender, non-distended MUSCULOSKELETAL:  No edema; No deformity  SKIN: Warm and dry NEUROLOGIC:  Alert and oriented x 3 PSYCHIATRIC:  Normal affect     Signed, Shirlee More, MD  08/13/2018 5:00 PM    [place]

## 2018-08-15 ENCOUNTER — Ambulatory Visit: Payer: Self-pay | Admitting: Cardiology

## 2018-08-17 ENCOUNTER — Encounter: Payer: Self-pay | Admitting: Cardiology

## 2018-10-24 ENCOUNTER — Ambulatory Visit: Payer: BC Managed Care – PPO | Admitting: Obstetrics and Gynecology

## 2018-10-24 ENCOUNTER — Encounter: Payer: Self-pay | Admitting: Obstetrics and Gynecology

## 2018-10-24 ENCOUNTER — Telehealth: Payer: Self-pay | Admitting: Obstetrics and Gynecology

## 2018-10-24 ENCOUNTER — Other Ambulatory Visit: Payer: Self-pay

## 2018-10-24 VITALS — BP 110/68 | HR 76 | Resp 16 | Ht 61.0 in | Wt 167.0 lb

## 2018-10-24 DIAGNOSIS — Z01419 Encounter for gynecological examination (general) (routine) without abnormal findings: Secondary | ICD-10-CM | POA: Diagnosis not present

## 2018-10-24 DIAGNOSIS — Z124 Encounter for screening for malignant neoplasm of cervix: Secondary | ICD-10-CM

## 2018-10-24 DIAGNOSIS — Z0000 Encounter for general adult medical examination without abnormal findings: Secondary | ICD-10-CM | POA: Diagnosis not present

## 2018-10-24 DIAGNOSIS — R232 Flushing: Secondary | ICD-10-CM

## 2018-10-24 DIAGNOSIS — F418 Other specified anxiety disorders: Secondary | ICD-10-CM | POA: Diagnosis not present

## 2018-10-24 DIAGNOSIS — L0292 Furuncle, unspecified: Secondary | ICD-10-CM | POA: Diagnosis not present

## 2018-10-24 DIAGNOSIS — R1030 Lower abdominal pain, unspecified: Secondary | ICD-10-CM

## 2018-10-24 MED ORDER — TRAMADOL HCL 50 MG PO TABS: 50.0000 mg | ORAL_TABLET | Freq: Four times a day (QID) | ORAL | 0 refills | Status: DC | PRN

## 2018-10-24 NOTE — Telephone Encounter (Signed)
Call reviewed with Dr. Talbert Nan. Add [facility] lab to blood collected. New order placed, lab notified.   Call returned to patient. Advised as seen above. Patient verbalizes understanding.  Routing to provider for final review. Patient is agreeable to disposition. Will close encounter.

## 2018-10-24 NOTE — Patient Instructions (Signed)

## 2018-10-24 NOTE — Progress Notes (Signed)
42 y.o. G2P2002 Married   Declined White or Caucasian female here for annual exam.  She has a mirena IUD, placed in 5/16 for cycle control. Occasional spotting. Not sexually active, no desire.  Her anxiety is through the roof, the depression comes and goes, worse prior to her cycle. Managed by her primary, has a script for prozac she hasn't started yet. Previously saw a Teacher, music and therapist, didn't feel it was helpful.  Thinks are okay at home. She has had situational stressors including the death of her nephew.   She had intense pain in her lower abdomen over the weekend. Mostly subsided, but still there. Currently tender, but intermittently it is severe. Ibuprofen doesn't help. Very severe on Saturday night. She is always constipated, had some very foul smelling stool. H/O severe adhesions at the time of her C/S.    H/O bilateral breast fibroadenomas. She does breast self exams, no changes.  She is on medication for SVT. On medication for hyperhidrosis.    She c/o a continued boil on her mons for over a year drains intermittently    No LMP recorded. (Menstrual status: IUD).          Sexually active: Yes.    The current method of family planning is IUD, vasectomy.    Exercising: No.  The patient does not participate in regular exercise at present. Smoker:  no  Health Maintenance: Pap:  06/02/17 Pap and HR HPV negative History of abnormal Pap:  yes MMG: 04-29-16- needed follow up DX MMG 04/2017 TDaP:  2018 Gardasil: never   reports that she has quit smoking. Her smoking use included cigarettes. She has a 5.00 pack-year smoking history. She has never used smokeless tobacco. She reports current alcohol use of about 3.0 - 4.0 standard drinks of alcohol per week. She reports that she does not use drugs. She is a [employer] [place]. Kids are 14 and 8.   Past Medical History:  Diagnosis Date  . Anemia   . Anxiety   . Anxiety 08/10/2016  . 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 Chest pain  08/04/2018  . Chronic interstitial cystitis 08/10/2016  . Depressive disorder 07/17/2014  . DUB (dysfunctional uterine bleeding) 01/10/2015  . Dysmenorrhea 01/10/2015  . History of recurrent UTIs   . Hormone disorder   . Hyperhidrosis 12/03/2017  . Interstitial cystitis   . Menorrhagia with irregular cycle 01/10/2015  . Neck pain 07/19/2018  . Palpitations 08/04/2018  . Polycystic ovaries 08/10/2016  . Premenstrual dysphoric disorder 01/10/2015  . PSVT (paroxysmal supraventricular tachycardia) ([place]) 08/04/2018  . Tachycardia 08/04/2018    Past Surgical History:  Procedure Laterality Date  . CESAREAN SECTION  x2  . INTRAUTERINE DEVICE (IUD) INSERTION  03/2015  . OVARIAN CYST REMOVAL  Age 58    Current Outpatient Medications  Medication Sig Dispense Refill  . FLUoxetine (PROZAC) 10 MG tablet Take by mouth daily.    Marland Kitchen glycopyrrolate (ROBINUL) 1 MG tablet Take 1 mg by mouth 2 (two) times daily.  5  . levonorgestrel (MIRENA) 20 MCG/24HR IUD 1 each by Intrauterine route once.    Marland Kitchen LORazepam (ATIVAN) 0.5 MG tablet TAKE 1 TABLET BY MOUTH EVERY SIX HOURS AS NEEDED  0  . metoprolol tartrate (LOPRESSOR) 25 MG tablet Take 25 mg by mouth 2 (two) times daily.  2  . traMADol (ULTRAM) 50 MG tablet tramadol 50 mg tablet  Take 1 tablet every day by oral route as needed.    . clindamycin (CLEOCIN T) 1 %  external solution Apply to groin folliculitis daily to twice daily as needed.     No current facility-administered medications for this visit.     Family History  Problem Relation Age of Onset  . Endometriosis Sister   . Endometriosis Mother   . Stroke Mother   . Endometriosis Maternal Aunt   . Endometriosis Maternal Aunt   . Heart attack Paternal Grandmother   . Hypertension Paternal Grandmother   . Hypertension Father   . Hypertension Sister   . Uterine cancer Other        paternal great GM    Review of Systems  Constitutional:       Weight gain Hot flashes  HENT: Negative.   Eyes:  Negative.   Respiratory: Negative.   Cardiovascular: Positive for palpitations.  Gastrointestinal: Positive for abdominal pain, constipation and diarrhea.       Bloating Change in quality/character of stools  Endocrine: Negative.   Genitourinary:       Loss of sexual intetrest  Musculoskeletal:       Muscle and joint pain in legs  Skin: Negative.   Allergic/Immunologic: Negative.   Neurological: Negative.   Hematological: Negative.   Psychiatric/Behavioral: The patient is nervous/anxious.        Difficulty with memory    Exam:   BP 110/68 (BP Location: Right Arm, Patient Position: Sitting, Cuff Size: Normal)   Pulse 76   Resp 16   Ht 5\' 1"  (1.549 m)   Wt 167 lb (75.8 kg)   BMI 31.55 kg/m   Weight change: @WEIGHTCHANGE @ Height:   Height: 5\' 1"  (154.9 cm)  Ht Readings from Last 3 Encounters:  10/24/18 5\' 1"  (1.549 m)  06/02/17 5\' 1"  (1.549 m)  06/01/16 5' 1.25" (1.556 m)    General appearance: alert, cooperative and appears stated age Head: Normocephalic, without obvious abnormality, atraumatic Neck: no adenopathy, supple, symmetrical, trachea midline and thyroid normal to inspection and palpation Lungs: clear to auscultation bilaterally Cardiovascular: regular rate and rhythm Breasts: normal appearance. Small bilateral breast lumps noted, felt better with her sitting. Right breast at 2 o'clock, left breast at 7 and 10 o'clock Abdomen: soft, mildly tender BLQ, no rebound, no guarding; non distended,  no masses,  no organomegaly Extremities: extremities normal, atraumatic, no cyanosis or edema Skin: Skin color, texture, turgor normal. No rashes or lesions Lymph nodes: Cervical, supraclavicular, and axillary nodes normal. No abnormal inguinal nodes palpated Neurologic: Grossly normal   Pelvic: External genitalia: small, <1 cm resolving boil on mons. No surrounding erythema, no drainage              Urethra:  normal appearing urethra with no masses, tenderness or lesions               Bartholins and Skenes: normal                 Vagina: normal appearing vagina with normal color and discharge, no lesions              Cervix: no lesions and IUD string 3-4 cm               Bimanual Exam:  Uterus:  anteverted, mobile, +/- tender.               Adnexa: no masses, mild diffuse tenderness               Rectovaginal: Confirms               Anus:  normal sphincter tone, no lesions  Chaperone was present for exam.  A:  Well Woman with normal exam  Lower abdominal pain, severe over the weekend. Currently improved, but still intermittent. Requesting pain medication other than ibuprofen. She doesn't have a surgical abdomen  IUD check  Bilateral fibroadenomas  Depression/anxiety, managed by primary MD. She has a script for prozac, hasn't started it yet. Encouraged to start it.  Small boil on mons, no surrounding erythema, appears to be healing    P:   No pap this year   Overdue for diagnostic mammograms, she will consider. Aware of the risk of missing cancer  Given a small amount of tramadol for prn use  If her pain gets severe she needs to go to the ER  If persists will set up an ultrasound  CBC to check her WBC  No pap this year  Screening labs (including CBC)  Overdue for diagnostic mammogram, she will consider, discussed the reasoning for mammogram  Screening labs  Warm compresses, hot soaks to resolving boil, wouldn't recommend incision or antibiotics at this point

## 2018-10-24 NOTE — Telephone Encounter (Signed)
At check-out patient requested to have labs done to check hormone levels due to having hot flashed.

## 2018-10-24 NOTE — Addendum Note (Signed)
Addended by: Harlan Stains on: 10/24/2018 04:39 PM   Modules accepted: Orders

## 2018-10-25 LAB — LIPID PANEL
CHOLESTEROL TOTAL: 181 mg/dL (ref 100–199)
Chol/HDL Ratio: 4.4 ratio (ref 0.0–4.4)
HDL: 41 mg/dL (ref >39)
LDL Calculated: 124 mg/dL — ABNORMAL HIGH (ref 0–99)
Triglycerides: 82 mg/dL (ref 0–149)
VLDL Cholesterol Cal: 16 mg/dL (ref 5–40)

## 2018-10-25 LAB — CBC
Hematocrit: 40.8 % (ref 34.0–46.6)
Hemoglobin: 13.9 g/dL (ref 11.1–15.9)
MCH: 30.1 pg (ref 26.6–33.0)
MCHC: 34.1 g/dL (ref 31.5–35.7)
MCV: 88 fL (ref 79–97)
Platelets: 337 10*3/uL (ref 150–450)
RBC: 4.62 x10E6/uL (ref 3.77–5.28)
RDW: 12.2 % — ABNORMAL LOW (ref 12.3–15.4)
WBC: 8.4 10*3/uL (ref 3.4–10.8)

## 2018-10-25 LAB — FOLLICLE STIMULATING HORMONE: FSH: 4.3 m[IU]/mL

## 2018-10-25 LAB — COMPREHENSIVE METABOLIC PANEL
ALK PHOS: 63 IU/L (ref 39–117)
ALT: 13 IU/L (ref 0–32)
AST: 16 IU/L (ref 0–40)
Albumin/Globulin Ratio: 1.9 (ref 1.2–2.2)
Albumin: 4.2 g/dL (ref 3.5–5.5)
BILIRUBIN TOTAL: 0.5 mg/dL (ref 0.0–1.2)
BUN/Creatinine Ratio: 12 (ref 9–23)
BUN: 9 mg/dL (ref 6–24)
CHLORIDE: 106 mmol/L (ref 96–106)
CO2: 21 mmol/L (ref 20–29)
Calcium: 9.1 mg/dL (ref 8.7–10.2)
Creatinine, Ser: 0.75 mg/dL (ref 0.57–1.00)
GFR calc Af Amer: 115 mL/min/{1.73_m2} (ref >59)
GFR calc non Af Amer: 100 mL/min/{1.73_m2} (ref >59)
Globulin, Total: 2.2 g/dL (ref 1.5–4.5)
Glucose: 87 mg/dL (ref 65–99)
Potassium: 4 mmol/L (ref 3.5–5.2)
Sodium: 140 mmol/L (ref 134–144)
Total Protein: 6.4 g/dL (ref 6.0–8.5)

## 2018-12-11 ENCOUNTER — Telehealth: Payer: Self-pay | Admitting: Obstetrics and Gynecology

## 2018-12-11 NOTE — Telephone Encounter (Signed)
Phone call from patient after hours.  [phone]  Patient states she is calling with an interstitial cystitis attack. She is asking for a Tramadol refill.  Reports she had not used the medication for one year and that she thought Dr. Talbert Nan was going to send in a refill at her last office visit.   She reports urgency with urination and then voids essentially no urine. She feels like her bladder is contracting.  She denies blood in her urine but states that is it coming soon because it usually does.  She feels distended. Denies fever/shakes/chills, nausea, vomiting, and back pain.   She has tried AZO, ibuprofen, and drinking water.   She declines going in to urgent care or [facility].  She states it is not worth it and is too expensive.   I told her I am not able to fill this medication during the weekend, and that her primary gynecologist would need to review the information and make a decision about her a refill of the medication and her care.   She is wanting to facilitate communication for tomorrow.  She has a planning period tomorrow during her work day, but she was unable to relay this information to me because the phone disconnected twice, and she has not returned my calls. She expressed some frustration about the fact that she may need to have communication and then get a call back.  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 I recommended she call the office first thing in the morning. I also reminded her that she could send a message through the My Chart communication system about her availability for conversation, but that this is not the fasted way to receive care.   Cc- Dr. Talbert Nan

## 2018-12-12 NOTE — Telephone Encounter (Signed)
She needs to see a Urologist to manage her Interstitial Cystitis. I don't recall if she has a provider at [facility], please offer to set up a consultation if needed. The tramadol I gave her in 12/19 for for an acute episode of pain. I don't prescribe ongoing opioid treatment. The recommendation was for an ultrasound if she had continued pain. It sounds like she was frustrated when she spoke with Dr Quincy Simmonds, please see that note.

## 2018-12-12 NOTE — Telephone Encounter (Signed)
Call to patient. Voice mail full, unable to leave message.  Per DPR, requests no message be left.

## 2018-12-13 NOTE — Telephone Encounter (Signed)
Return call to patient. Per DPR can not leave message. Left message stating " this is doctors office returning a call, you mat call back tomorrow after 8 am and ask for triage nurse." No name and no identifing information left.

## 2018-12-13 NOTE — Telephone Encounter (Signed)
Patient returning call.

## 2018-12-13 NOTE — Telephone Encounter (Signed)
Routing to S. Yeakley, RN.  

## 2018-12-26 NOTE — Telephone Encounter (Signed)
This is Dr. Etheleen Mayhew patient.  I will forward this to her.   Cc- Dr. Talbert Nan

## 2018-12-26 NOTE — Telephone Encounter (Signed)
No response from patient. Ok to close encounter?

## 2019-07-14 ENCOUNTER — Ambulatory Visit: Payer: BC Managed Care – PPO | Admitting: Obstetrics and Gynecology

## 2019-07-14 ENCOUNTER — Telehealth: Payer: Self-pay | Admitting: Obstetrics and Gynecology

## 2019-07-14 ENCOUNTER — Encounter: Payer: Self-pay | Admitting: Obstetrics and Gynecology

## 2019-07-14 ENCOUNTER — Other Ambulatory Visit: Payer: Self-pay

## 2019-07-14 VITALS — BP 102/60 | HR 76 | Temp 97.1°F | Resp 12 | Ht 61.0 in | Wt 163.0 lb

## 2019-07-14 DIAGNOSIS — K649 Unspecified hemorrhoids: Secondary | ICD-10-CM

## 2019-07-14 DIAGNOSIS — L732 Hidradenitis suppurativa: Secondary | ICD-10-CM

## 2019-07-14 DIAGNOSIS — R102 Pelvic and perineal pain: Secondary | ICD-10-CM

## 2019-07-14 DIAGNOSIS — K625 Hemorrhage of anus and rectum: Secondary | ICD-10-CM | POA: Diagnosis not present

## 2019-07-14 MED ORDER — LIDOCAINE 5 % EX OINT: 1.0000 "application " | TOPICAL_OINTMENT | Freq: Four times a day (QID) | CUTANEOUS | 0 refills | Status: DC | PRN

## 2019-07-14 MED ORDER — HYDROCORTISONE (PERIANAL) 2.5 % EX CREA: TOPICAL_CREAM | Freq: Two times a day (BID) | CUTANEOUS | 1 refills | Status: DC

## 2019-07-14 MED ORDER — CLINDAMYCIN PHOSPHATE 1 % EX SOLN: CUTANEOUS | 1 refills | Status: DC

## 2019-07-14 NOTE — Patient Instructions (Addendum)
Hemorrhoids Hemorrhoids are swollen veins in and around the rectum or anus. There are two types of hemorrhoids:  Internal hemorrhoids. These occur in the veins that are just inside the rectum. They may poke through to the outside and become irritated and painful.  External hemorrhoids. These occur in the veins that are outside the anus and can be felt as a painful swelling or hard lump near the anus. Most hemorrhoids do not cause serious problems, and they can be managed with home treatments such as diet and lifestyle changes. If home treatments do not help the symptoms, procedures can be done to shrink or remove the hemorrhoids. What are the causes? This condition is caused by increased pressure in the anal area. This pressure may result from various things, including:  Constipation.  Straining to have a bowel movement.  Diarrhea.  Pregnancy.  Obesity.  Sitting for long periods of time.  Heavy lifting or other activity that causes you to strain.  Anal sex.  Riding a bike for a long period of time. What are the signs or symptoms? Symptoms of this condition include:  Pain.  Anal itching or irritation.  Rectal bleeding.  Leakage of stool (feces).  Anal swelling.  One or more lumps around the anus. How is this diagnosed? This condition can often be diagnosed through a visual exam. Other exams or tests may also be done, such as:  An exam that involves feeling the rectal area with a gloved hand (digital rectal exam).  An exam of the anal canal that is done using a small tube (anoscope).  A blood test, if you have lost a significant amount of blood.  A test to look inside the colon using a flexible tube with a camera on the end (sigmoidoscopy or colonoscopy). How is this treated? This condition can usually be treated at home. However, various procedures may be done if dietary changes, lifestyle changes, and other home treatments do not help your symptoms. These  procedures can help make the hemorrhoids smaller or remove them completely. Some of these procedures involve surgery, and others do not. Common procedures include:  Rubber band ligation. Rubber bands are placed at the base of the hemorrhoids to cut off their blood supply.  Sclerotherapy. Medicine is injected into the hemorrhoids to shrink them.  Infrared coagulation. A type of light energy is used to get rid of the hemorrhoids.  Hemorrhoidectomy surgery. The hemorrhoids are surgically removed, and the veins that supply them are tied off.  Stapled hemorrhoidopexy surgery. The surgeon staples the base of the hemorrhoid to the rectal wall. Follow these instructions at home: Eating and drinking   Eat foods that have a lot of fiber in them, such as whole grains, beans, nuts, fruits, and vegetables.  Ask your health care provider about taking products that have added fiber (fiber supplements).  Reduce the amount of fat in your diet. You can do this by eating low-fat dairy products, eating less red meat, and avoiding processed foods.  Hidradenitis Suppurativa Hidradenitis suppurativa is a long-term (chronic) skin disease. It is similar to a severe form of acne, but it affects areas of the body where acne would be unusual, especially areas of the body where skin rubs against skin and becomes moist. These include: Underarms. Groin. Genital area. Buttocks. Upper thighs. Breasts. Hidradenitis suppurativa may start out as small lumps or pimples caused by blocked sweat glands or hair follicles. Pimples may develop into deep sores that break open (rupture) and drain pus. Over  time, affected areas of skin may thicken and become scarred. This condition is rare and does not spread from person to person (non-contagious). What are the causes? The exact cause of this condition is not known. It may be related to: Female and female hormones. An overactive disease-fighting system (immune system). The immune  system may over-react to blocked hair follicles or sweat glands and cause swelling and pus-filled sores. What increases the risk? You are more likely to develop this condition if you: Are female. Are 47-23 years old. Have a family history of hidradenitis suppurativa. Have a personal history of acne. Are overweight. Smoke. Take the medicine lithium. What are the signs or symptoms? The first symptoms are usually painful bumps in the skin, similar to pimples. The condition may get worse over time (progress), or it may only cause mild symptoms. If the disease progresses, symptoms may include: Skin bumps getting bigger and growing deeper into the skin. Bumps rupturing and draining pus. Itchy, infected skin. Skin getting thicker and scarred. Tunnels under the skin (fistulas) where pus drains from a bump. Pain during daily activities, such as pain during walking if your groin area is affected. Emotional problems, such as stress or depression. This condition may affect your appearance and your ability or willingness to wear certain clothes or do certain activities. How is this diagnosed? This condition is diagnosed by a health care provider who specializes in skin diseases (dermatologist). You may be diagnosed based on: Your symptoms and medical history. A physical exam. Testing a pus sample for infection. Blood tests. How is this treated? Your treatment will depend on how severe your symptoms are. The same treatment will not work for everybody with this condition. You may need to try several treatments to find what works best for you. Treatment may include: Cleaning and bandaging (dressing) your wounds as needed. Lifestyle changes, such as new skin care routines. Taking medicines, such as: Antibiotics. Acne medicines. Medicines to reduce the activity of the immune system. A diabetes medicine (metformin). Birth control pills, for women. Steroids to reduce swelling and pain. Working with a  mental health care provider, if you experience emotional distress due to this condition. If you have severe symptoms that do not get better with medicine, you may need surgery. Surgery may involve: Using a laser to clear the skin and remove hair follicles. Opening and draining deep sores. Removing the areas of skin that are diseased and scarred. Follow these instructions at home: Medicines  Take over-the-counter and prescription medicines only as told by your health care provider. If you were prescribed an antibiotic medicine, take it as told by your health care provider. Do not stop taking the antibiotic even if your condition improves. Skin care If you have open wounds, cover them with a clean dressing as told by your health care provider. Keep wounds clean by washing them gently with soap and water when you bathe. Do not shave the areas where you get hidradenitis suppurativa. Do not wear deodorant. Wear loose-fitting clothes. Try to avoid getting overheated or sweaty. If you get sweaty or wet, change into clean, dry clothes as soon as you can. To help relieve pain and itchiness, cover sore areas with a warm, clean washcloth (warm compress) for 5-10 minutes as often as needed. If told by your health care provider, take a bleach bath twice a week: Fill your bathtub halfway with water. Pour in  cup of unscented household bleach. Soak in the tub for 5-10 minutes. Only soak from  the neck down. Avoid water on your face and hair. Shower to rinse off the bleach from your skin. General instructions Learn as much as you can about your disease so that you have an active role in your treatment. Work closely with your health care provider to find treatments that work for you. If you are overweight, work with your health care provider to lose weight as recommended. Do not use any products that contain nicotine or tobacco, such as cigarettes and e-cigarettes. If you need help quitting, ask your health  care provider. If you struggle with living with this condition, talk with your health care provider or work with a mental health care provider as recommended. Keep all follow-up visits as told by your health care provider. This is important. Where to find more information Hidradenitis [place].: [email or web address]-foundation.org/ Contact a health care provider if you have: A flare-up of hidradenitis suppurativa. A fever or chills. Trouble controlling your symptoms at home. Trouble doing your daily activities because of your symptoms. Trouble dealing with emotional problems related to your condition. Summary Hidradenitis suppurativa is a long-term (chronic) skin disease. It is similar to a severe form of acne, but it affects areas of the body where acne would be unusual. The first symptoms are usually painful bumps in the skin, similar to pimples. The condition may get worse over time (progress), or it may only cause mild symptoms. If you have open wounds, cover them with a clean dressing as told by your health care provider. Keep wounds clean by washing them gently with soap and water when you bathe. Besides skin care, treatment may include medicines, laser treatment, and surgery. This information is not intended to replace advice given to you by your health care provider. Make sure you discuss any questions you have with your health care provider. Document Released: 06/02/2004 Document Revised: 10/27/2017 Document Reviewed: 10/27/2017 Elsevier Patient Education  2020 [employer].  Drink enough fluid to keep your urine pale yellow. Managing pain and swelling   Take warm sitz baths for 20 minutes, 3-4 times a day to ease pain and discomfort. You may do this in a bathtub or using a portable sitz bath that fits over the toilet.  If directed, apply ice to the affected area. Using ice packs between sitz baths may be helpful. ? Put ice in a plastic bag. ? Place a towel between  your skin and the bag. ? Leave the ice on for 20 minutes, 2-3 times a day. General instructions  Take over-the-counter and prescription medicines only as told by your health care provider.  Use medicated creams or suppositories as told.  Get regular exercise. Ask your health care provider how much and what kind of exercise is best for you. In general, you should do moderate exercise for at least 30 minutes on most days of the week (150 minutes each week). This can include activities such as walking, biking, or yoga.  Go to the bathroom when you have the urge to have a bowel movement. Do not wait.  Avoid straining to have bowel movements.  Keep the anal area dry and clean. Use wet toilet paper or moist towelettes after a bowel movement.  Do not sit on the toilet for long periods of time. This increases blood pooling and pain.  Keep all follow-up visits as told by your health care provider. This is important. Contact a health care provider if you have:  Increasing pain and swelling that are not controlled by treatment  or medicine.  Difficulty having a bowel movement, or you are unable to have a bowel movement.  Pain or inflammation outside the area of the hemorrhoids. Get help right away if you have:  Uncontrolled bleeding from your rectum. Summary  Hemorrhoids are swollen veins in and around the rectum or anus.  Most hemorrhoids can be managed with home treatments such as diet and lifestyle changes.  Taking warm sitz baths can help ease pain and discomfort.  In severe cases, procedures or surgery can be done to shrink or remove the hemorrhoids. This information is not intended to replace advice given to you by your health care provider. Make sure you discuss any questions you have with your health care provider. Document Released: 10/16/2000 Document Revised: 10/27/2018 Document Reviewed: 03/10/2018 Elsevier Patient Education  2020 [employer].

## 2019-07-14 NOTE — Telephone Encounter (Signed)
Routing to Dr Jertson. FYI. Encounter closed.  

## 2019-07-14 NOTE — Telephone Encounter (Signed)
Patient is calling regarding "a lot of blood" from her rectum. Patient is experiencing "an uncomfortable pain." Patient scheduled office visit for 11:30AM with Dr. Talbert Nan today. Advised nurse would return call if any further recommendations.

## 2019-07-14 NOTE — Progress Notes (Signed)
GYNECOLOGY  VISIT   HPI: 43 y.o.   Married   Declined White or Caucasian Declined  female   [phone] with No LMP recorded. (Menstrual status: IUD).   here for  Rectal bleeding. Patient states that she had severe adhesions with last C-section that could be causing problems. Per patient, would like to discuss "hair bumps" in the vaginal area.   She has an IUD and doesn't have regular cycles.  She noticed blood in the rectal area after voiding yesterday. Happened again later in the day. Large amount. Not a lot on her underwear, mostly with wiping.  Last BM was last night, bleed. She has had problems with her BM's since her C/S. Feels like there is something in her rectum that shouldn't be there. Currently hurting with BM, started yesterday.  She has a h/o a lump around her anus, was told previously that it wasn't a hemorrhoid.   She c/oconstant cramping in her pelvic area for ~1year. The baseline discomfort is mild. Occasional sharp shooting pains, lasts for minutes to hours.  Occasional deep dyspareunia.   She has bouts of constipation to diarrhea since her second c/s. She was told she had terrible adhesions "worst he had seen", had mesh placed in her abdominal wall at the time of her c/s.   In the last month her IC has been worse.   GYNECOLOGIC HISTORY: No LMP recorded. (Menstrual status: IUD). Contraception:IUD, mirena placed in 5/16 Menopausal hormone therapy: none        OB History    Gravida  2   Para  2   Term  2   Preterm      AB      Living  2     SAB      TAB      Ectopic      Multiple      Live Births                 Patient Active Problem List   Diagnosis Date Noted  . Chest pain 08/04/2018  . Palpitations 08/04/2018  . Tachycardia 08/04/2018  . PSVT (paroxysmal supraventricular tachycardia) ([place]) 08/04/2018  . Neck pain 07/19/2018  . Hyperhidrosis 12/03/2017  . Anxiety 08/10/2016  . Chronic interstitial cystitis 08/10/2016  . Polycystic  ovaries 08/10/2016  . 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 Premenstrual dysphoric disorder 01/10/2015  . Dysmenorrhea 01/10/2015  . DUB (dysfunctional uterine bleeding) 01/10/2015  . Menorrhagia with irregular cycle 01/10/2015  . Depressive disorder 07/17/2014    Past Medical History:  Diagnosis Date  . Anemia   . Anxiety   . Anxiety 08/10/2016  . 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 Chest pain 08/04/2018  . Chronic interstitial cystitis 08/10/2016  . Depressive disorder 07/17/2014  . DUB (dysfunctional uterine bleeding) 01/10/2015  . Dysmenorrhea 01/10/2015  . History of recurrent UTIs   . Hormone disorder   . Hyperhidrosis 12/03/2017  . Interstitial cystitis   . Menorrhagia with irregular cycle 01/10/2015  . Neck pain 07/19/2018  . Palpitations 08/04/2018  . Polycystic ovaries 08/10/2016  . Premenstrual dysphoric disorder 01/10/2015  . PSVT (paroxysmal supraventricular tachycardia) ([place]) 08/04/2018  . Tachycardia 08/04/2018    Past Surgical History:  Procedure Laterality Date  . CESAREAN SECTION  x2  . INTRAUTERINE DEVICE (IUD) INSERTION  03/2015  . OVARIAN CYST REMOVAL  Age 89    Current Outpatient Medications  Medication Sig Dispense Refill  . clindamycin (CLEOCIN T) 1 % external solution Apply to groin folliculitis daily to  twice daily as needed.    . doxycycline (VIBRAMYCIN) 100 MG capsule Take 100 mg by mouth daily.    Marland Kitchen FLUoxetine (PROZAC) 10 MG tablet Take by mouth daily.    Marland Kitchen glycopyrrolate (ROBINUL) 1 MG tablet Take 1 mg by mouth as needed.   5  . levonorgestrel (MIRENA) 20 MCG/24HR IUD 1 each by Intrauterine route once.    Marland Kitchen LORazepam (ATIVAN) 0.5 MG tablet TAKE 1 TABLET BY MOUTH EVERY SIX HOURS AS NEEDED  0  . metoprolol tartrate (LOPRESSOR) 25 MG tablet Take 25 mg by mouth as needed.   2  . mupirocin ointment (BACTROBAN) 2 % Place 1 application into the nose 2 (two) times daily.    . traMADol (ULTRAM) 50 MG tablet tramadol 50 mg tablet  Take 1 tablet every day by oral route  as needed.     No current facility-administered medications for this visit.      ALLERGIES: Abilify [aripiprazole] and Sulfa antibiotics  Family History  Problem Relation Age of Onset  . Endometriosis Sister   . Endometriosis Mother   . Stroke Mother   . Endometriosis Maternal Aunt   . Endometriosis Maternal Aunt   . Heart attack Paternal Grandmother   . Hypertension Paternal Grandmother   . Hypertension Father   . Hypertension Sister   . Uterine cancer Other        paternal great GM    Social History   Socioeconomic History  . Marital status: Married    Spouse name: Not on file  . Number of children: Not on file  . Years of education: Not on file  . Highest education level: Not on file  Occupational History  . Not on file  Social Needs  . Financial resource strain: Not on file  . Food insecurity    Worry: Not on file    Inability: Not on file  . Transportation needs    Medical: Not on file    Non-medical: Not on file  Tobacco Use  . Smoking status: Former Smoker    Packs/day: 0.25    Years: 20.00    Pack years: 5.00    Types: Cigarettes  . Smokeless tobacco: Never Used  Substance and Sexual Activity  . Alcohol use: Yes    Alcohol/week: 3.0 - 4.0 standard drinks    Types: 3 - 4 Standard drinks or equivalent per week  . Drug use: No  . Sexual activity: Yes    Partners: Male    Birth control/protection: Surgical, I.U.D.    Comment: Vasectomy/Mirena IUD inserted 03/2015  Lifestyle  . Physical activity    Days per week: Not on file    Minutes per session: Not on file  . Stress: Not on file  Relationships  . Social Herbalist on phone: Not on file    Gets together: Not on file    Attends religious service: Not on file    Active member of club or organization: Not on file    Attends meetings of clubs or organizations: Not on file    Relationship status: Not on file  . Intimate partner violence    Fear of current or ex partner: Not on file     Emotionally abused: Not on file    Physically abused: Not on file    Forced sexual activity: Not on file  Other Topics Concern  . Not on file  Social History Narrative  . Not on file    Review of  Systems  Constitutional: Negative.   HENT: Negative.   Eyes: Negative.   Respiratory: Negative.   Cardiovascular: Negative.   Gastrointestinal:       Rectal bleeding  Genitourinary: Negative.   Musculoskeletal: Negative.   Skin: Negative.   Neurological: Negative.   Endo/Heme/Allergies: Negative.   Psychiatric/Behavioral: Negative.   All other systems reviewed and are negative.   PHYSICAL EXAMINATION:    BP 102/60 (BP Location: Right Arm, Patient Position: Sitting, Cuff Size: Normal)   Pulse 76   Temp (!) 97.1 F (36.2 C) (Temporal)   Resp 12   Ht 5\' 1"  (1.549 m)   Wt 163 lb (73.9 kg)   BMI 30.80 kg/m     General appearance: alert, cooperative and appears stated age Abdomen: soft, non-tender; non distended, no masses,  no organomegaly  Pelvic: External genitalia:  Multiple areas of scarring from prior boils, one healing boil on her right upper thigh              Urethra:  normal appearing urethra with no masses, tenderness or lesions              Bartholins and Skenes: normal                 Vagina: normal appearing vagina with normal color and discharge, no lesions              Cervix: no cervical motion tenderness, no lesions and IUD string 3 cm              Bimanual Exam:  Uterus:  anteverted, mobile, mildly tender              Adnexa: no masses, tender on the left              Rectovaginal: Yes.  .  Confirms.              Anus:  normal sphincter tone. She has an anal tag, with separating the anus a hemorrhoid with a cut and clot seen at 3 o'clock.   Chaperone was present for exam.  ASSESSMENT Bowls go from diarrhea to constipation Rectal bleeding Pelvic pain H/O significant scarring at the time of her 2nd c/s Hidradenitis suppurativa, no active infection, but  scarring seen  IUD     PLAN Treat hemorrhoid with sitz bath, ice, anusol and lidocaine Clindamycin for hidradenitis, f/u in 3 months UPT negative Genprobe Return for gyn ultrasound If her ultrasound is normal will consider a trial of doxycycline for possible endometritis   An After Visit Summary was printed and given to the patient.  ~25 minutes face to face time of which over 50% was spent in counseling.

## 2019-07-17 ENCOUNTER — Telehealth: Payer: Self-pay | Admitting: Obstetrics and Gynecology

## 2019-07-17 NOTE — Telephone Encounter (Signed)
Call to patient. Patient advised GC/Chl probe has not resulted yet, would be updated once received and reviewed by Dr. Talbert Nan. Patient agreeable and appreciative of phone call.   Routing to provider and will close encounter.

## 2019-07-17 NOTE — Telephone Encounter (Signed)
Spoke with patient in regards to benefit for recommended ultrasound. Patient acknowledges understanding of information presented. 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 Patient is scheduled 07/25/2019 with Dr Talbert Nan, at 4:30. Patient is aware of the appointment date, arrival time and cancellation policy.   Patient is asking for lab results, from appointment last week.  Routing to Triage Nurse  cc: Lamont Snowball, RN

## 2019-07-18 LAB — GC/CHLAMYDIA PROBE AMP
Chlamydia trachomatis, NAA: NEGATIVE
Neisseria Gonorrhoeae by PCR: NEGATIVE

## 2019-07-21 ENCOUNTER — Other Ambulatory Visit: Payer: Self-pay

## 2019-07-24 NOTE — Progress Notes (Signed)
GYNECOLOGY  VISIT   HPI: 43 y.o.   Married   Declined White or Caucasian Declined  female   [phone] with No LMP recorded. (Menstrual status: IUD).   here for consult following PUS done for pelvic pain. She c/o constant cramping in her pelvis for ~1 year, occasional shooting pains and occasional deep dyspareunia. She had mild uterine tenderness during an exam earlier this month.  She has a mirena IUD, placed in 5/16.  She has a h/o IC and bowel issues (constipation to diarrhea).       GYNECOLOGIC HISTORY: No LMP recorded. (Menstrual status: IUD). Contraception: mirena IUD Menopausal hormone therapy: NA        OB History    Gravida  2   Para  2   Term  2   Preterm      AB      Living  2     SAB      TAB      Ectopic      Multiple      Live Births                 Patient Active Problem List   Diagnosis Date Noted  . Chest pain 08/04/2018  . Palpitations 08/04/2018  . Tachycardia 08/04/2018  . PSVT (paroxysmal supraventricular tachycardia) ([place]) 08/04/2018  . Neck pain 07/19/2018  . Hyperhidrosis 12/03/2017  . Anxiety 08/10/2016  . Chronic interstitial cystitis 08/10/2016  . Polycystic ovaries 08/10/2016  . 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 Premenstrual dysphoric disorder 01/10/2015  . Dysmenorrhea 01/10/2015  . DUB (dysfunctional uterine bleeding) 01/10/2015  . Menorrhagia with irregular cycle 01/10/2015  . Depressive disorder 07/17/2014    Past Medical History:  Diagnosis Date  . Anemia   . Anxiety   . Anxiety 08/10/2016  . 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 Chest pain 08/04/2018  . Chronic interstitial cystitis 08/10/2016  . Depressive disorder 07/17/2014  . DUB (dysfunctional uterine bleeding) 01/10/2015  . Dysmenorrhea 01/10/2015  . History of recurrent UTIs   . Hormone disorder   . Hyperhidrosis 12/03/2017  . Interstitial cystitis   . Menorrhagia with irregular cycle 01/10/2015  . Neck pain 07/19/2018  . Palpitations 08/04/2018  . Polycystic  ovaries 08/10/2016  . Premenstrual dysphoric disorder 01/10/2015  . PSVT (paroxysmal supraventricular tachycardia) ([place]) 08/04/2018  . Tachycardia 08/04/2018    Past Surgical History:  Procedure Laterality Date  . CESAREAN SECTION  x2  . INTRAUTERINE DEVICE (IUD) INSERTION  03/2015  . OVARIAN CYST REMOVAL  Age 80    Current Outpatient Medications  Medication Sig Dispense Refill  . clindamycin (CLEOCIN T) 1 % external solution Apply topically BID for 3 minths 60 mL 1  . levonorgestrel (MIRENA) 20 MCG/24HR IUD 1 each by Intrauterine route once.    . lidocaine (XYLOCAINE) 5 % ointment Apply 1 application topically 4 (four) times daily as needed. Don't use for longer than one week 30 g 0  . doxycycline (VIBRAMYCIN) 100 MG capsule Take 100 mg by mouth daily.    Marland Kitchen FLUoxetine (PROZAC) 10 MG tablet Take by mouth daily.    Marland Kitchen glycopyrrolate (ROBINUL) 1 MG tablet Take 1 mg by mouth as needed.   5  . hydrocortisone (ANUSOL-HC) 2.5 % rectal cream Place rectally 2 (two) times daily. (Patient not taking: Reported on 07/25/2019) 30 g 1  . LORazepam (ATIVAN) 0.5 MG tablet TAKE 1 TABLET BY MOUTH EVERY SIX HOURS AS NEEDED  0  . metoprolol tartrate (LOPRESSOR) 25 MG tablet Take  25 mg by mouth as needed.   2  . mupirocin ointment (BACTROBAN) 2 % Place 1 application into the nose 2 (two) times daily.    . traMADol (ULTRAM) 50 MG tablet tramadol 50 mg tablet  Take 1 tablet every day by oral route as needed.     No current facility-administered medications for this visit.      ALLERGIES: Abilify [aripiprazole] and Sulfa antibiotics  Family History  Problem Relation Age of Onset  . Endometriosis Sister   . Endometriosis Mother   . Stroke Mother   . Endometriosis Maternal Aunt   . Endometriosis Maternal Aunt   . Heart attack Paternal Grandmother   . Hypertension Paternal Grandmother   . Hypertension Father   . Hypertension Sister   . Uterine cancer Other        paternal great GM    Social History    Socioeconomic History  . Marital status: Married    Spouse name: Not on file  . Number of children: Not on file  . Years of education: Not on file  . Highest education level: Not on file  Occupational History  . Not on file  Social Needs  . Financial resource strain: Not on file  . Food insecurity    Worry: Not on file    Inability: Not on file  . Transportation needs    Medical: Not on file    Non-medical: Not on file  Tobacco Use  . Smoking status: Former Smoker    Packs/day: 0.25    Years: 20.00    Pack years: 5.00    Types: Cigarettes  . Smokeless tobacco: Never Used  Substance and Sexual Activity  . Alcohol use: Yes    Alcohol/week: 3.0 - 4.0 standard drinks    Types: 3 - 4 Standard drinks or equivalent per week  . Drug use: No  . Sexual activity: Yes    Partners: Male    Birth control/protection: Surgical, I.U.D.    Comment: Vasectomy/Mirena IUD inserted 03/2015  Lifestyle  . Physical activity    Days per week: Not on file    Minutes per session: Not on file  . Stress: Not on file  Relationships  . Social Herbalist on phone: Not on file    Gets together: Not on file    Attends religious service: Not on file    Active member of club or organization: Not on file    Attends meetings of clubs or organizations: Not on file    Relationship status: Not on file  . Intimate partner violence    Fear of current or ex partner: Not on file    Emotionally abused: Not on file    Physically abused: Not on file    Forced sexual activity: Not on file  Other Topics Concern  . Not on file  Social History Narrative  . Not on file    Review of Systems  Constitutional: Negative.   HENT: Negative.   Eyes: Negative.   Respiratory: Negative.   Cardiovascular: Negative.   Gastrointestinal: Negative.   Genitourinary: Negative.   Musculoskeletal: Negative.   Skin: Negative.   Neurological: Negative.   Endo/Heme/Allergies: Negative.   Psychiatric/Behavioral:  Negative.     PHYSICAL EXAMINATION:    BP 118/84 (BP Location: Right Arm, Patient Position: Sitting, Cuff Size: Normal)   Pulse 68   Temp (!) 97.2 F (36.2 C) (Skin)     General appearance: alert, cooperative and appears stated age  Ultrasound images reviewed with the patient. Normal ultrasound, IUD in place. Her superior uterus does move, no clear adhesions to the anterior abdominal wall. The angle of her uterus suggests lower uterine segment adhesions. She does have a hypoechoic region in her abdominal wall, not tender with palpation. Reports a h/o mesh used at the time of her c/s, not seen.   ASSESSMENT Pelvic pain for the last year H/O C/S with adhesions H/O IC H/O bowel issues, constipation to diarrhea IUD in place    PLAN Trial of doxycycline for possible endometritis If her pain persists, would recommend she f/u with Urology and GI for other possible causes of her pain Could remove her IUD, but pain started way after the IUD was placed.    An After Visit Summary was printed and given to the patient.

## 2019-07-25 ENCOUNTER — Other Ambulatory Visit: Payer: Self-pay | Admitting: Obstetrics and Gynecology

## 2019-07-25 ENCOUNTER — Other Ambulatory Visit: Payer: Self-pay

## 2019-07-25 ENCOUNTER — Encounter: Payer: Self-pay | Admitting: Obstetrics and Gynecology

## 2019-07-25 ENCOUNTER — Ambulatory Visit: Payer: BC Managed Care – PPO | Admitting: Obstetrics and Gynecology

## 2019-07-25 ENCOUNTER — Other Ambulatory Visit (INDEPENDENT_AMBULATORY_CARE_PROVIDER_SITE_OTHER): Payer: BC Managed Care – PPO

## 2019-07-25 DIAGNOSIS — R102 Pelvic and perineal pain: Secondary | ICD-10-CM

## 2019-07-25 MED ORDER — DOXYCYCLINE HYCLATE 100 MG PO CAPS: 100.0000 mg | ORAL_CAPSULE | Freq: Two times a day (BID) | ORAL | 0 refills | Status: DC

## 2019-08-21 ENCOUNTER — Encounter: Payer: Self-pay | Admitting: Obstetrics and Gynecology

## 2019-08-21 ENCOUNTER — Telehealth: Payer: Self-pay | Admitting: Obstetrics and Gynecology

## 2019-08-21 NOTE — Telephone Encounter (Signed)
Patient sent the following correspondence through [facility].  I am having a very painful IC attack.  I am down to 3 Tramadol. 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  I had planned to get a sub and come in for a visit, but now I can't.  What can you do to help me?

## 2019-08-21 NOTE — Telephone Encounter (Signed)
Call to patient. Message given to patient as seen below from Dr. Talbert Nan. Patient states, "well I guess I wasted my time" that bleeding is a classic sign of IC for her. States, "please let Dr. Talbert Nan know that." States she has dealt with this for so long and she needs someone "I can depend on to help manage this." RN advised urologist is best option for managing IC due to urinary nature. Patient states she is going to have to think about her options. RN advised would update Dr. Talbert Nan.   Routing to provider and will close encounter.

## 2019-08-21 NOTE — Telephone Encounter (Signed)
Call to patient. Patient states that she is currently having an IC attack. States she felt her symptoms started a few weeks ago. Last night woke up and felt her lower abdomen was swollen and tender and was in severe pain. Also complaining of dysuria, urinary frequency and "a light amount of blood" in her urine. Not passing blood clots yet. States she is down to her last 3 tablets of tramadol. Took 50 mg of tramadol and 200 mg of ibuprofen one hour ago. States "it is making the pain a little more bearable." Denies fever or pelvic pain otherwise. Patient states she does not have a urologist. Those visits were "too expensive" and can't afford to keep going. Patient declines OV. 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 RN advised would need to review with Dr. Talbert Nan and return call with recommendations. Patient agreeable.   Routing to provider for review.

## 2019-08-21 NOTE — Telephone Encounter (Signed)
Unfortunately, I think she should be seen somewhere to have a urine culture done (here, primary, urgent care). Bleeding is not classic for IC, need to r/o UTI. I think she does need a Urologist to manage her IC. It is out of my scope of practice.

## 2019-11-08 ENCOUNTER — Ambulatory Visit: Payer: BC Managed Care – PPO | Admitting: Obstetrics and Gynecology

## 2020-08-29 ENCOUNTER — Other Ambulatory Visit: Payer: Self-pay

## 2020-09-02 ENCOUNTER — Encounter: Payer: Self-pay | Admitting: Cardiology

## 2020-09-02 ENCOUNTER — Other Ambulatory Visit: Payer: Self-pay

## 2020-09-02 ENCOUNTER — Ambulatory Visit: Payer: BC Managed Care – PPO | Admitting: Cardiology

## 2020-09-02 VITALS — BP 132/84 | HR 106 | Ht 61.0 in | Wt 147.0 lb

## 2020-09-02 DIAGNOSIS — R002 Palpitations: Secondary | ICD-10-CM

## 2020-09-02 DIAGNOSIS — F17210 Nicotine dependence, cigarettes, uncomplicated: Secondary | ICD-10-CM | POA: Diagnosis not present

## 2020-09-02 DIAGNOSIS — I471 Supraventricular tachycardia: Secondary | ICD-10-CM

## 2020-09-02 DIAGNOSIS — E782 Mixed hyperlipidemia: Secondary | ICD-10-CM | POA: Diagnosis not present

## 2020-09-02 DIAGNOSIS — R011 Cardiac murmur, unspecified: Secondary | ICD-10-CM | POA: Insufficient documentation

## 2020-09-02 HISTORY — DX: Cardiac murmur, unspecified: R01.1

## 2020-09-02 HISTORY — DX: Mixed hyperlipidemia: E78.2

## 2020-09-02 HISTORY — DX: Nicotine dependence, cigarettes, uncomplicated: F17.210

## 2020-09-02 NOTE — Progress Notes (Signed)
Cardiology Office Note:    Date:  09/02/2020   ID:  Courtney Goodwin, DOB 1975/11/06, MRN [id number]  PCP:  Courtney Dress, MD  Cardiologist:  Courtney Lindau, MD   Referring MD: Courtney Dress, MD    ASSESSMENT:    1. PSVT (paroxysmal supraventricular tachycardia) ([place])   2. Mixed dyslipidemia   3. Cigarette smoker   4. Palpitations   5. Cardiac murmur    PLAN:    In order of problems listed above:  1. Primary prevention stressed with the patient.  Importance of compliance with diet medication stressed and she vocalized understanding.  She was advised to walk at least half an hour on a regular basis. 2. Chest discomfort: Atypical.  Does not occur with exertion.  However for risk stratification in view of multiple risk factors I will do a calcium CT score.  She is agreeable with this. 3. Cardiac murmur: Echocardiogram will be done to assess murmur on auscultation. 4. Mixed dyslipidemia: Diet was emphasized and weight reduction was stressed.  Her LDL is elevated.  She agrees.  We will recheck her blood work when she comes to see me in follow-up in appointment in 3 months. 5. Cigarette smoker: I spent 5 minutes with the patient discussing solely about smoking. Smoking cessation was counseled. I suggested to the patient also different medications and pharmacological interventions. Patient is keen to try stopping on its own at this time. He will get back to me if he needs any further assistance in this matter.  Patient had multiple questions which were answered to satisfaction.   Medication Adjustments/Labs and Tests Ordered: Current medicines are reviewed at length with the patient today.  Concerns regarding medicines are outlined above.  No orders of the defined types were placed in this encounter.  No orders of the defined types were placed in this encounter.    History of Present Illness:    Courtney Goodwin is a 44 y.o. female who is being seen today for the evaluation of  palpitations and chest discomfort at the request of Courtney Dress, MD.  Patient is a pleasant 44 year old female.  She has past medical history of supraventricular tachycardia and is a cigarette smoker unfortunately.  She has history of dyslipidemia.  She denies any problems at this time and takes care of activities of daily living.  She mentions to me that several months ago when she was under significant stress her blood pressure was elevated and she felt palpitations.  No orthopnea PND syncope or any such symptoms.  At the time of my evaluation, the patient is alert awake oriented and in no distress.  She has strong family history of coronary artery disease and therefore wants to be evaluated.  She walks some on a regular basis without any symptoms of chest pain.  Past Medical History:  Diagnosis Date  . Anemia   . Anxiety   . Anxiety 08/10/2016  . 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 Chest pain 08/04/2018  . Chronic interstitial cystitis 08/10/2016  . Depressive disorder 07/17/2014  . DUB (dysfunctional uterine bleeding) 01/10/2015  . Dysmenorrhea 01/10/2015  . History of recurrent UTIs   . Hormone disorder   . Hyperhidrosis 12/03/2017  . Interstitial cystitis   . Menorrhagia with irregular cycle 01/10/2015  . Neck pain 07/19/2018  . Palpitations 08/04/2018  . Polycystic ovaries 08/10/2016  . Premenstrual dysphoric disorder 01/10/2015  . PSVT (paroxysmal supraventricular tachycardia) ([place]) 08/04/2018  . Tachycardia 08/04/2018    Past  Surgical History:  Procedure Laterality Date  . CESAREAN SECTION  x2  . INTRAUTERINE DEVICE (IUD) INSERTION  03/2015  . OVARIAN CYST REMOVAL  Age 76    Current Medications: Current Meds  Medication Sig  . amphetamine-dextroamphetamine (ADDERALL XR) 20 MG 24 hr capsule Take 20 mg by mouth every morning.  Marland Kitchen glycopyrrolate (ROBINUL) 1 MG tablet Take 1 mg by mouth as needed.   Marland Kitchen levonorgestrel (MIRENA) 20 MCG/24HR IUD 1 each by Intrauterine route once.  Marland Kitchen  LORazepam (ATIVAN) 0.5 MG tablet TAKE 1 TABLET BY MOUTH EVERY SIX HOURS AS NEEDED  . metoprolol tartrate (LOPRESSOR) 25 MG tablet Take 25 mg by mouth as needed.   . traMADol (ULTRAM) 50 MG tablet tramadol 50 mg tablet  Take 1 tablet every day by oral route as needed.     Allergies:   Abilify [aripiprazole] and Sulfa antibiotics   Social History   Socioeconomic History  . Marital status: Married    Spouse name: Not on file  . Number of children: Not on file  . Years of education: Not on file  . Highest education level: Not on file  Occupational History  . Not on file  Tobacco Use  . Smoking status: Former Smoker    Packs/day: 0.25    Years: 20.00    Pack years: 5.00    Types: Cigarettes  . Smokeless tobacco: Never Used  Vaping Use  . Vaping Use: Never used  Substance and Sexual Activity  . Alcohol use: Yes    Alcohol/week: 3.0 - 4.0 standard drinks    Types: 3 - 4 Standard drinks or equivalent per week  . Drug use: No  . Sexual activity: Yes    Partners: Male    Birth control/protection: Surgical, I.U.D.    Comment: Vasectomy/Mirena IUD inserted 03/2015  Other Topics Concern  . Not on file  Social History Narrative  . Not on file   Social Determinants of Health   Financial Resource Strain:   . Difficulty of Paying Living Expenses: Not on file  Food Insecurity:   . Worried About Charity fundraiser in the Last Year: Not on file  . Ran Out of Food in the Last Year: Not on file  Transportation Needs:   . Lack of Transportation (Medical): Not on file  . Lack of Transportation (Non-Medical): Not on file  Physical Activity:   . Days of Exercise per Week: Not on file  . Minutes of Exercise per Session: Not on file  Stress:   . Feeling of Stress : Not on file  Social Connections:   . Frequency of Communication with Friends and Family: Not on file  . Frequency of Social Gatherings with Friends and Family: Not on file  . Attends Religious Services: Not on file  .  Active Member of Clubs or Organizations: Not on file  . Attends Archivist Meetings: Not on file  . Marital Status: Not on file     Family History: The patient's family history includes Endometriosis in her maternal aunt, maternal aunt, mother, and sister; Heart attack in her paternal grandmother; Hypertension in her father, paternal grandmother, and sister; Stroke in her mother; Uterine cancer in an other family member.  ROS:   Please see the history of present illness.    All other systems reviewed and are negative.  EKGs/Labs/Other Studies Reviewed:    The following studies were reviewed today: EKG reveals sinus rhythm and nonspecific ST-T changes.   Recent Labs: No  results found for requested labs within last 8760 hours.  Recent Lipid Panel    Component Value Date/Time   CHOL 181 10/24/2018 1622   TRIG 82 10/24/2018 1622   HDL 41 10/24/2018 1622   CHOLHDL 4.4 10/24/2018 1622   CHOLHDL 3.5 06/01/2016 1637   VLDL 10 06/01/2016 1637   LDLCALC 124 (H) 10/24/2018 1622    Physical Exam:    VS:  BP 132/84   Pulse (!) 106   Ht 5\' 1"  (1.549 m)   Wt 147 lb (66.7 kg)   SpO2 99%   BMI 27.78 kg/m     Wt Readings from Last 3 Encounters:  09/02/20 147 lb (66.7 kg)  07/14/19 163 lb (73.9 kg)  10/24/18 167 lb (75.8 kg)     GEN: Patient is in no acute distress HEENT: Normal NECK: No JVD; No carotid bruits LYMPHATICS: No lymphadenopathy CARDIAC: S1 S2 regular, 2/6 systolic murmur at the apex. RESPIRATORY:  Clear to auscultation without rales, wheezing or rhonchi  ABDOMEN: Soft, non-tender, non-distended MUSCULOSKELETAL:  No edema; No deformity  SKIN: Warm and dry NEUROLOGIC:  Alert and oriented x 3 PSYCHIATRIC:  Normal affect    Signed, Courtney Lindau, MD  09/02/2020 4:30 PM    [facility]

## 2020-09-02 NOTE — Patient Instructions (Signed)
Medication Instructions:  No medication changes. *If you need a refill on your cardiac medications before your next appointment, please call your pharmacy*   Lab Work: Your physician recommends that you return for lab work in: 3 months (12/03/20). You need to have labs done when you are fasting.  You can come Monday through Friday 8:30 am to 12:00 pm and 1:15 to 4:30. You do not need to make an appointment as the order has already been placed. The labs you are going to have done are BMET,  LFT and Lipids.  If you have labs (blood work) drawn today and your tests are completely normal, you will receive your results only by: Marland Kitchen MyChart Message (if you have MyChart) OR . A paper copy in the mail If you have any lab test that is abnormal or we need to change your treatment, we will call you to review the results.   Testing/Procedures: Your physician has requested that you have an echocardiogram. Echocardiography is a painless test that uses sound waves to create images of your heart. It provides your doctor with information about the size and shape of your heart and how well your heart's chambers and valves are working. This procedure takes approximately one hour. There are no restrictions for this procedure.  We will order CT coronary calcium score $150  Please call [phone] to schedule   [facility]  [address], [place] [address]    Follow-Up: At [facility], you and your health needs are our priority.  As part of our continuing mission to provide you with exceptional heart care, we have created designated Provider Care Teams.  These Care Teams include your primary Cardiologist (physician) and Advanced Practice Providers (APPs -  Physician Assistants and Nurse Practitioners) who all work together to provide you with the care you need, when you need it.  We recommend signing up for the patient portal called "MyChart".  Sign up information is provided on this  After Visit Summary.  MyChart is used to connect with patients for Virtual Visits (Telemedicine).  Patients are able to view lab/test results, encounter notes, upcoming appointments, etc.  Non-urgent messages can be sent to your provider as well.   To learn more about what you can do with MyChart, go to [email or web address].    Your next appointment:   3 month(s)  The format for your next appointment:   In Person  Provider:   Jyl Heinz, MD   Other Instructions  Why Jod[facility] Red? Easy to use Just put your fingers on the sensors--no wires, patches or gels required.  Affordable Starting at $89 with free shipping in the [place]. And you can pay with pre-tax dollars using your FSA, HSA or HRA.  Portable Check in with your heart on the go. Just slip it in your pocket and take it with you.  Trusted FDA-cleared, medical-grade EKG recordings. 30-day money-back guarantee if you're not satisfied.  Share with your doctor Track data over time or email medical-grade recordings directly to your doctor for review.  [email or web address]

## 2020-09-03 ENCOUNTER — Telehealth: Payer: Self-pay

## 2020-09-03 LAB — BASIC METABOLIC PANEL
BUN/Creatinine Ratio: 10 (ref 9–23)
BUN: 9 mg/dL (ref 6–24)
CO2: 23 mmol/L (ref 20–29)
Calcium: 10.1 mg/dL (ref 8.7–10.2)
Chloride: 106 mmol/L (ref 96–106)
Creatinine, Ser: 0.89 mg/dL (ref 0.57–1.00)
GFR calc Af Amer: 92 mL/min/{1.73_m2} (ref >59)
GFR calc non Af Amer: 80 mL/min/{1.73_m2} (ref >59)
Glucose: 94 mg/dL (ref 65–99)
Potassium: 4.3 mmol/L (ref 3.5–5.2)
Sodium: 142 mmol/L (ref 134–144)

## 2020-09-03 LAB — HEPATIC FUNCTION PANEL
ALT: 12 IU/L (ref 0–32)
AST: 17 IU/L (ref 0–40)
Albumin: 4.6 g/dL (ref 3.8–4.8)
Alkaline Phosphatase: 75 IU/L (ref 44–121)
Bilirubin Total: 0.5 mg/dL (ref 0.0–1.2)
Bilirubin, Direct: 0.14 mg/dL (ref 0.00–0.40)
Total Protein: 6.5 g/dL (ref 6.0–8.5)

## 2020-09-03 LAB — LIPID PANEL
Chol/HDL Ratio: 4 ratio (ref 0.0–4.4)
Cholesterol, Total: 192 mg/dL (ref 100–199)
HDL: 48 mg/dL (ref >39)
LDL Chol Calc (NIH): 137 mg/dL — ABNORMAL HIGH (ref 0–99)
Triglycerides: 39 mg/dL (ref 0–149)
VLDL Cholesterol Cal: 7 mg/dL (ref 5–40)

## 2020-09-03 NOTE — Telephone Encounter (Signed)
Pt would like to know what a left posterior fascicular block means on her EKG.

## 2020-09-04 NOTE — Telephone Encounter (Signed)
Mild problem with electrical conduction through her conduction system.  Nothing to be concerned at this time.

## 2020-09-18 ENCOUNTER — Ambulatory Visit (INDEPENDENT_AMBULATORY_CARE_PROVIDER_SITE_OTHER): Payer: BC Managed Care – PPO

## 2020-09-18 ENCOUNTER — Other Ambulatory Visit: Payer: Self-pay

## 2020-09-18 DIAGNOSIS — R002 Palpitations: Secondary | ICD-10-CM

## 2020-09-18 DIAGNOSIS — Q231 Congenital insufficiency of aortic valve: Secondary | ICD-10-CM

## 2020-09-18 LAB — ECHOCARDIOGRAM COMPLETE
Area-P 1/2: 2.95 cm2
S' Lateral: 2.8 cm

## 2020-09-18 NOTE — Progress Notes (Signed)
Complete echocardiogram has been performed.  Jimmy Diondre Pulis RDCS, RVT 

## 2020-09-24 ENCOUNTER — Encounter: Payer: Self-pay | Admitting: Cardiology

## 2020-10-03 ENCOUNTER — Other Ambulatory Visit: Payer: Self-pay

## 2020-10-03 ENCOUNTER — Ambulatory Visit (INDEPENDENT_AMBULATORY_CARE_PROVIDER_SITE_OTHER)
Admission: RE | Admit: 2020-10-03 | Discharge: 2020-10-03 | Disposition: A | Discharge status: Home / Self Care | Payer: Self-pay | Source: Ambulatory Visit | Attending: Cardiology | Admitting: Cardiology

## 2020-10-03 DIAGNOSIS — F17210 Nicotine dependence, cigarettes, uncomplicated: Secondary | ICD-10-CM

## 2020-10-03 DIAGNOSIS — R002 Palpitations: Secondary | ICD-10-CM

## 2020-10-03 DIAGNOSIS — E782 Mixed hyperlipidemia: Secondary | ICD-10-CM

## 2020-10-03 DIAGNOSIS — I471 Supraventricular tachycardia: Secondary | ICD-10-CM

## 2020-10-04 ENCOUNTER — Inpatient Hospital Stay: Admission: RE | Admit: 2020-10-04 | Payer: BC Managed Care – PPO | Source: Ambulatory Visit

## 2020-11-02 DIAGNOSIS — U071 COVID-19: Secondary | ICD-10-CM

## 2020-11-02 HISTORY — DX: COVID-19: U07.1

## 2020-11-05 ENCOUNTER — Encounter: Payer: Self-pay | Admitting: Nurse Practitioner

## 2020-11-05 DIAGNOSIS — Z720 Tobacco use: Secondary | ICD-10-CM | POA: Insufficient documentation

## 2020-11-29 DIAGNOSIS — Z87440 Personal history of urinary (tract) infections: Secondary | ICD-10-CM | POA: Insufficient documentation

## 2020-11-29 DIAGNOSIS — D649 Anemia, unspecified: Secondary | ICD-10-CM | POA: Insufficient documentation

## 2020-11-29 DIAGNOSIS — N3010 Interstitial cystitis (chronic) without hematuria: Secondary | ICD-10-CM | POA: Insufficient documentation

## 2020-11-29 DIAGNOSIS — E349 Endocrine disorder, unspecified: Secondary | ICD-10-CM | POA: Insufficient documentation

## 2020-12-03 ENCOUNTER — Other Ambulatory Visit: Payer: Self-pay

## 2020-12-03 ENCOUNTER — Encounter: Payer: Self-pay | Admitting: Cardiology

## 2020-12-03 ENCOUNTER — Ambulatory Visit (INDEPENDENT_AMBULATORY_CARE_PROVIDER_SITE_OTHER): Payer: BC Managed Care – PPO | Admitting: Cardiology

## 2020-12-03 VITALS — BP 130/76 | HR 90 | Ht 61.0 in | Wt 148.2 lb

## 2020-12-03 DIAGNOSIS — I471 Supraventricular tachycardia: Secondary | ICD-10-CM

## 2020-12-03 DIAGNOSIS — Q231 Congenital insufficiency of aortic valve: Secondary | ICD-10-CM | POA: Insufficient documentation

## 2020-12-03 DIAGNOSIS — F17210 Nicotine dependence, cigarettes, uncomplicated: Secondary | ICD-10-CM

## 2020-12-03 HISTORY — DX: Congenital insufficiency of aortic valve: Q23.1

## 2020-12-03 NOTE — Progress Notes (Signed)
Cardiology Office Note:    Date:  12/03/2020   ID:  Courtney Goodwin, DOB 1976-10-25, MRN [id number]  PCP:  Nicoletta Dress, MD  Cardiologist:  Jenean Lindau, MD   Referring MD: Nicoletta Dress, MD    ASSESSMENT:    1. PSVT (paroxysmal supraventricular tachycardia) ([place])   2. Cigarette smoker   3. Bicuspid aortic valve    PLAN:    In order of problems listed above:  1. Primary prevention stressed with patient.  Importance of compliance with diet medication stressed and she vocalized understanding.  Importance of regular exercise stressed.  I told her to walk at least half an hour a day 5 days a week and she promises to do so. 2. Bicuspid aortic valve: Stable.  Aortic evaluation unremarkable by CT scan. 3. Cigarette smoker: I counseled her against smoking and she promises to do better. 4. Mixed dyslipidemia: Diet and exercise emphasized and she promises to take care of herself better.  Lipids were reviewed from [facility] sheet. 5. Patient will be seen in follow-up appointment in 6 months or earlier if the patient has any concerns    Medication Adjustments/Labs and Tests Ordered: Current medicines are reviewed at length with the patient today.  Concerns regarding medicines are outlined above.  No orders of the defined types were placed in this encounter.  No orders of the defined types were placed in this encounter.    No chief complaint on file.    History of Present Illness:    Courtney Goodwin is a 45 y.o. female.  Patient has past medical history of COVID-19 infection, PSVT and tobacco abuse.  She denies any problems at this time.  Her echocardiogram revealed bicuspid aortic valve.  Her CT of the chest was unremarkable with no aneurysm or any such issues in the aorta.  She leads a sedentary lifestyle.  At the time of my evaluation, the patient is alert awake oriented and in no distress.  Past Medical History:  Diagnosis Date  . Anemia   . Anxiety   . Anxiety 08/10/2016  .  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 Cardiac murmur 09/02/2020  . Chest pain 08/04/2018  . Chronic interstitial cystitis 08/10/2016  . Cigarette smoker 09/02/2020  . COVID-19 virus infection 11/2020  . Depressive disorder 07/17/2014  . DUB (dysfunctional uterine bleeding) 01/10/2015  . Dysmenorrhea 01/10/2015  . History of recurrent UTIs   . Hormone disorder   . Hyperhidrosis 12/03/2017  . Interstitial cystitis   . Menorrhagia with irregular cycle 01/10/2015  . Mixed dyslipidemia 09/02/2020  . Neck pain 07/19/2018  . Palpitations 08/04/2018  . Polycystic ovaries 08/10/2016  . Premenstrual dysphoric disorder 01/10/2015  . PSVT (paroxysmal supraventricular tachycardia) ([place]) 08/04/2018  . Tachycardia 08/04/2018  . Tobacco abuse     Past Surgical History:  Procedure Laterality Date  . CESAREAN SECTION  x2  . INTRAUTERINE DEVICE (IUD) INSERTION  03/2015  . OVARIAN CYST REMOVAL  Age 81    Current Medications: Current Meds  Medication Sig  . amphetamine-dextroamphetamine (ADDERALL XR) 20 MG 24 hr capsule Take 20 mg by mouth every morning.  Marland Kitchen glycopyrrolate (ROBINUL) 1 MG tablet Take 1 mg by mouth as needed (hyperhidrosis).  . LORazepam (ATIVAN) 0.5 MG tablet Take 0.5 mg by mouth as needed for anxiety.  . metoprolol tartrate (LOPRESSOR) 25 MG tablet Take 25 mg by mouth as needed (blood pressure).  . traMADol (ULTRAM) 50 MG tablet Take 50 mg by mouth as needed for moderate  pain.     Allergies:   Sulfa antibiotics and Abilify [aripiprazole]   Social History   Socioeconomic History  . Marital status: Married    Spouse name: Not on file  . Number of children: Not on file  . Years of education: Not on file  . Highest education level: Not on file  Occupational History  . Not on file  Tobacco Use  . Smoking status: Former Smoker    Packs/day: 0.25    Years: 20.00    Pack years: 5.00    Types: Cigarettes  . Smokeless tobacco: Never Used  Vaping Use  . Vaping Use: Never used  Substance and  Sexual Activity  . Alcohol use: Yes    Alcohol/week: 3.0 - 4.0 standard drinks    Types: 3 - 4 Standard drinks or equivalent per week  . Drug use: No  . Sexual activity: Yes    Partners: Male    Birth control/protection: Surgical, I.U.D.    Comment: Vasectomy/Mirena IUD inserted 03/2015  Other Topics Concern  . Not on file  Social History Narrative  . Not on file   Social Determinants of Health   Financial Resource Strain: Not on file  Food Insecurity: Not on file  Transportation Needs: Not on file  Physical Activity: Not on file  Stress: Not on file  Social Connections: Not on file     Family History: The patient's family history includes Endometriosis in her maternal aunt, maternal aunt, mother, and sister; Heart attack in her paternal grandmother; Hypertension in her father, paternal grandmother, and sister; Stroke in her mother; Uterine cancer in an other family member.  ROS:   Please see the history of present illness.    All other systems reviewed and are negative.  EKGs/Labs/Other Studies Reviewed:    The following studies were reviewed today: I discussed CT scan report from [place] [facility] with her at length.   Recent Labs: 09/03/2020: ALT 12; BUN 9; Creatinine, Ser 0.89; Potassium 4.3; Sodium 142  Recent Lipid Panel    Component Value Date/Time   CHOL 192 09/03/2020 0841   TRIG 39 09/03/2020 0841   HDL 48 09/03/2020 0841   CHOLHDL 4.0 09/03/2020 0841   CHOLHDL 3.5 06/01/2016 1637   VLDL 10 06/01/2016 1637   LDLCALC 137 (H) 09/03/2020 0841    Physical Exam:    VS:  BP 130/76   Pulse 90   Ht 5\' 1"  (1.549 m)   Wt 148 lb 3.2 oz (67.2 kg)   SpO2 98%   BMI 28.00 kg/m     Wt Readings from Last 3 Encounters:  12/03/20 148 lb 3.2 oz (67.2 kg)  09/02/20 147 lb (66.7 kg)  07/14/19 163 lb (73.9 kg)     GEN: Patient is in no acute distress HEENT: Normal NECK: No JVD; No carotid bruits LYMPHATICS: No lymphadenopathy CARDIAC: Hear sounds regular, 2/6  systolic murmur at the apex. RESPIRATORY:  Clear to auscultation without rales, wheezing or rhonchi  ABDOMEN: Soft, non-tender, non-distended MUSCULOSKELETAL:  No edema; No deformity  SKIN: Warm and dry NEUROLOGIC:  Alert and oriented x 3 PSYCHIATRIC:  Normal affect   Signed, Jenean Lindau, MD  12/03/2020 4:26 PM    [facility]

## 2020-12-03 NOTE — Patient Instructions (Signed)

## 2020-12-30 ENCOUNTER — Telehealth: Payer: Self-pay | Admitting: *Deleted

## 2020-12-30 NOTE — Telephone Encounter (Signed)
Patient called and left message in triage voicemail stating she needs a order faxed to [facility] for bilateral diag. Mammogram. Patient did not provide the information for me to send this order. I called patient to ask for this information and ask her to schedule her annual which is overdue, however her voicemail is full. Unable to leave a message.

## 2021-01-02 NOTE — Telephone Encounter (Signed)
I called patient again regarding the below note, she said her PCP faxed order to [facility] for her. I did remind patient she is overdue for her annual exam.

## 2021-06-11 IMAGING — CT CT CARDIAC CORONARY ARTERY CALCIUM SCORE
3 series · 14 of 20 positions shown, 15 images · non-contrast
Comparison: 09/25/2020 chest CT from [REDACTED].
COMPARISON: 09/25/2020 chest CT from [REDACTED].

Addendum:
EXAM:
OVER-READ INTERPRETATION  CT CHEST

The following report is an over-read performed by radiologist Dr.
Nazareth Jumper [REDACTED] on 10/03/2020. This over-read
does not include interpretation of cardiac or coronary anatomy or
pathology. The calcium score interpretation by the cardiologist is
attached.
CLINICAL DATA: Risk stratification
Coronary Calcium Score
TECHNIQUE: The patient was scanned on a Siemens Force scanner. Axial
non-contrast 3 mm slices were carried out through the heart. The
data set was analyzed on a dedicated work station and scored using
the Agatson method.

[Series 2: casc 3.0 bv41 2 bestsyst 38 % · axial · 0.29mm/px · z∈[-219,-138]mm · 4 of 46 slices shown, 5 images]
[im 10/46  vessel]
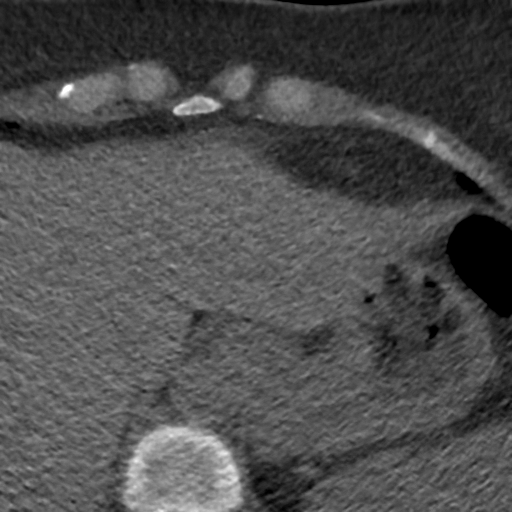
[im 10/46  lung]
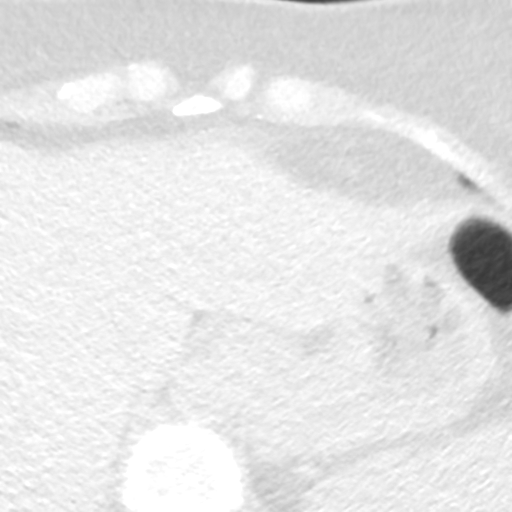
[im 19/46  vessel]
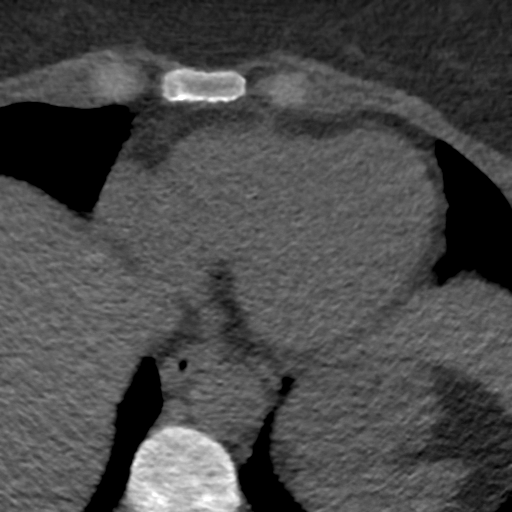
[im 28/46  vessel]
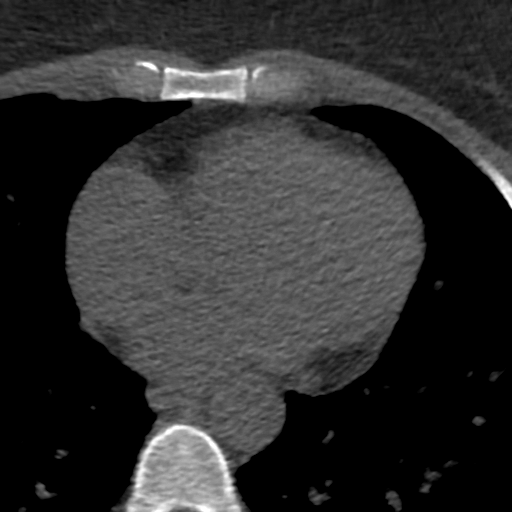
[im 37/46  vessel]
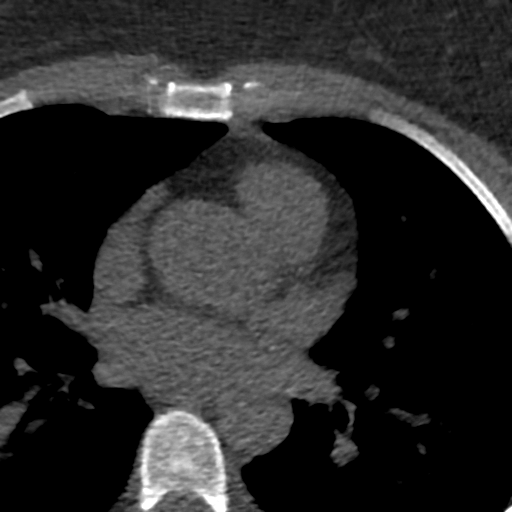

[Series 3: lung 38 % · axial · 0.68mm/px · z∈[-224,-134]mm · 5 of 46 slices shown]
[im 8/46  lung]
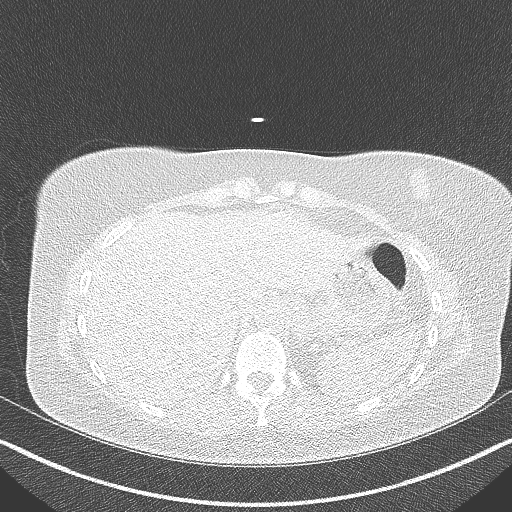
[im 16/46  lung]
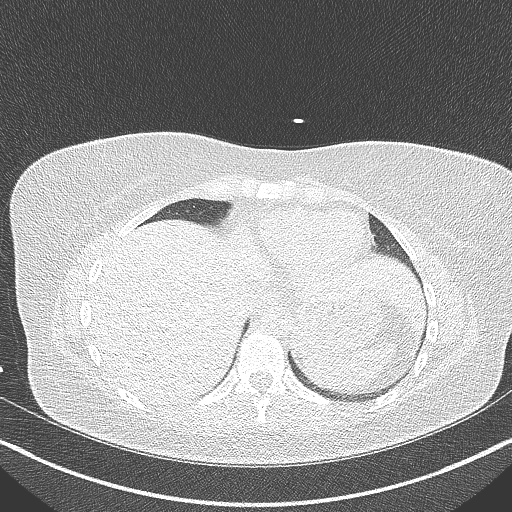
[im 23/46  lung]
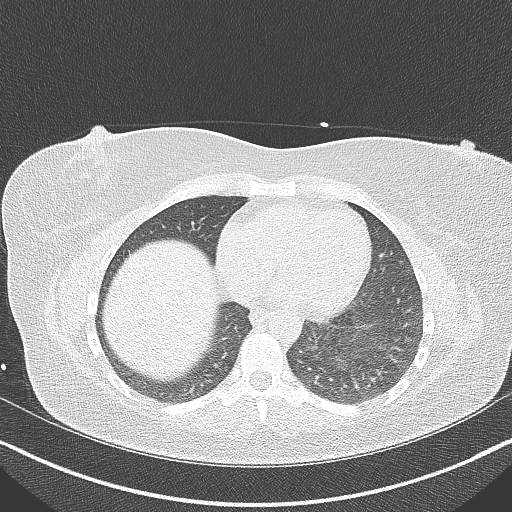
[im 31/46  lung]
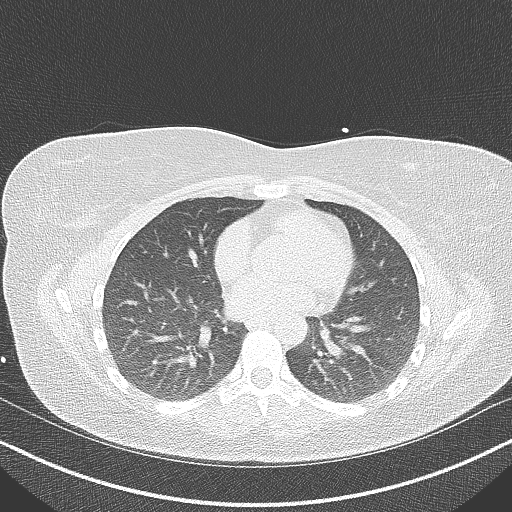
[im 38/46  lung]
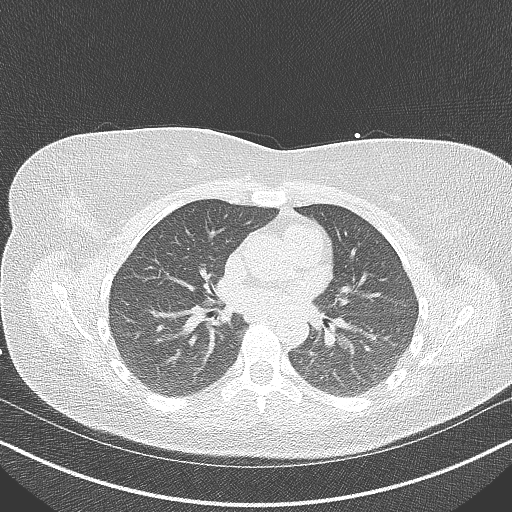

[Series 4: lung st 38 % · axial · 0.68mm/px · z∈[-224,-134]mm · 5 of 46 slices shown]
[im 8/46  lung]
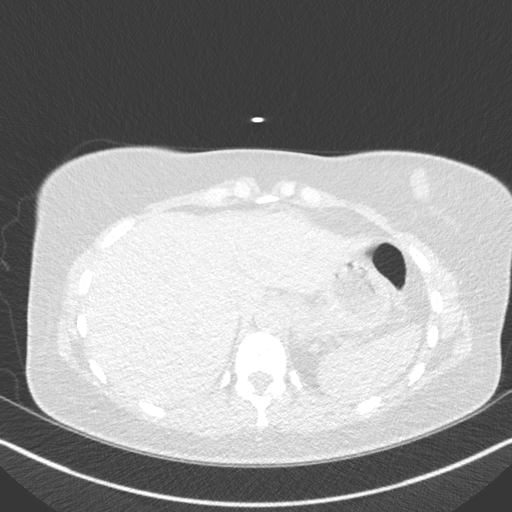
[im 16/46  lung]
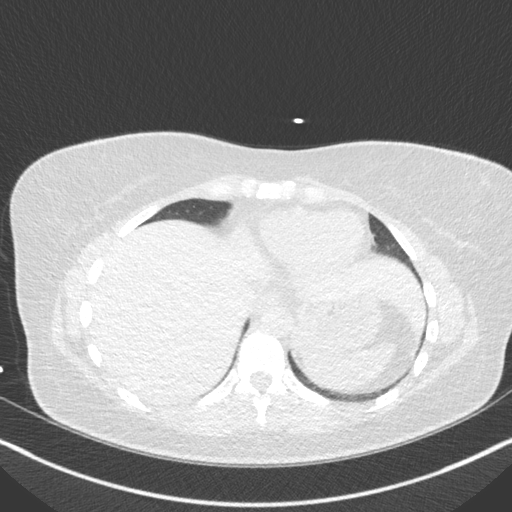
[im 23/46  lung]
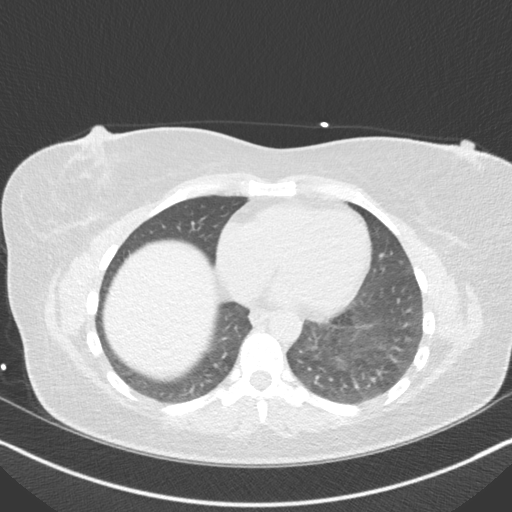
[im 31/46  lung]
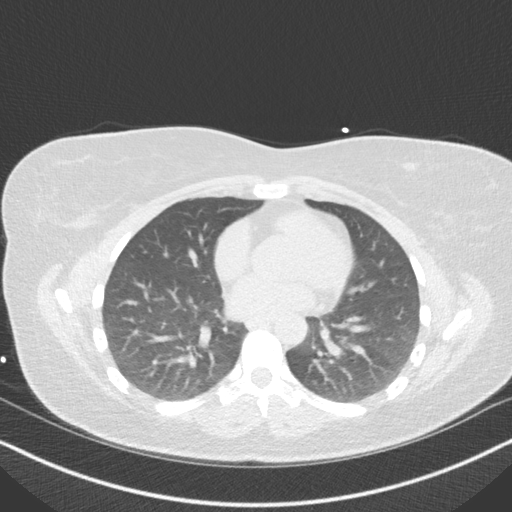
[im 38/46  lung]
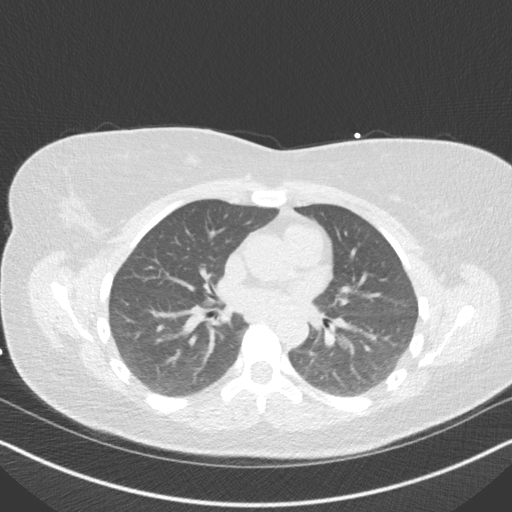

[14 of 20 positions shown; findings below may reference images not displayed]

FINDINGS: Vascular: Normal aortic caliber.

Mediastinum/Nodes: No imaged thoracic adenopathy.

Lungs/Pleura: No pleural fluid.  Clear imaged lungs.

Upper Abdomen: Normal imaged portions of the liver, spleen, stomach,
pancreas, adrenal glands, left kidney.

Musculoskeletal: Similar appearance of bilateral breast nodules
including an inferior left sided nodule of maximally 2.3 cm. No
acute osseous abnormality.
IMPRESSION: 1.  No acute findings in the imaged extracardiac chest.
2. Bilateral breast nodules, as on 09/25/2020. The patient has had
prior workups for fibroadenomas. Consider repeat mammogram and
ultrasound, if not up-to-date.
FINDINGS: Non-cardiac: See separate report from [REDACTED].

Ascending Aorta: Normal

Pericardium: Normal

Coronary arteries: Normal origin
IMPRESSION: Coronary calcium score of 0. This was 0 percentile for age and sex
matched control.

[place],DO

*** End of Addendum ***
EXAM:
OVER-READ INTERPRETATION  CT CHEST

The following report is an over-read performed by radiologist Dr.
Jocelita Herbst [REDACTED] on 10/03/2020. This over-read
does not include interpretation of cardiac or coronary anatomy or
pathology. The calcium score interpretation by the cardiologist is
attached.
FINDINGS: Vascular: Normal aortic caliber.

Mediastinum/Nodes: No imaged thoracic adenopathy.

Lungs/Pleura: No pleural fluid.  Clear imaged lungs.

Upper Abdomen: Normal imaged portions of the liver, spleen, stomach,
pancreas, adrenal glands, left kidney.

Musculoskeletal: Similar appearance of bilateral breast nodules
including an inferior left sided nodule of maximally 2.3 cm. No
acute osseous abnormality.
IMPRESSION: 1.  No acute findings in the imaged extracardiac chest.
2. Bilateral breast nodules, as on 09/25/2020. The patient has had
prior workups for fibroadenomas. Consider repeat mammogram and
ultrasound, if not up-to-date.

## 2021-08-21 ENCOUNTER — Encounter: Payer: Self-pay | Admitting: Neurology

## 2021-08-27 ENCOUNTER — Other Ambulatory Visit: Payer: Self-pay

## 2021-08-27 ENCOUNTER — Ambulatory Visit: Payer: BC Managed Care – PPO | Admitting: Neurology

## 2021-08-27 ENCOUNTER — Encounter: Payer: Self-pay | Admitting: Neurology

## 2021-08-27 VITALS — BP 126/83 | HR 86 | Ht 61.0 in | Wt 139.2 lb

## 2021-08-27 DIAGNOSIS — R29818 Other symptoms and signs involving the nervous system: Secondary | ICD-10-CM | POA: Diagnosis not present

## 2021-08-27 NOTE — Patient Instructions (Signed)
Good to meet you!  Schedule 1-hour EEG. If normal, we will plan for a 72-hour EEG  2. We may also consider doing a sleep study  3. Would continue management of anxiety (?PTSD) with your family doctor  4. Follow-up after tests, call for any changes

## 2021-08-27 NOTE — Progress Notes (Signed)
NEUROLOGY CONSULTATION NOTE  Courtney Goodwin MRN: [id number] DOB: 30-May-1976  Referring provider: Dr. Nelda Bucks Primary care provider: Dr. Nelda Bucks  Reason for consult:  seizure-like activity  Dear Dr Delena Bali:  Thank you for your kind referral of Courtney Goodwin for consultation of the above symptoms. Although her history is well known to you, please allow me to reiterate it for the purpose of our medical record. She is alone in the office today. Records and images were personally reviewed where available.   HISTORY OF PRESENT ILLNESS: This is a 45 year old right-handed woman with a history of anxiety, ADD, presenting for evaluation of seizure-like activity. The first episode occurred on 08/01/21, she was laying down trying to sleep when her legs uncontrollably drew up in a fetal position with her chin flexing down to her chest. She could feel her head going side to side, does not remember what happened to her arms but "something happened." She does not think she lost consciousness but felt out of it. When she "came to," she heard a sound that she did not recognize but realized it was her making a weird moaning sound. She was scared and could not breathe/could not get air in. Once she was able to, she started crying and the tension released. She woke her husband and told him it was going to happen again, then the exact episode occurred with her legs drawing in, head flexed to chest, unable to control it. It did not seem to last long, when she came to it felt closed in her throat. She reports it occurred a total of 3 times with her body feeling very relaxed before having the next one. She went to sleep and woke up the next morning feeling out of it with her legs hurting so bad. Three days later, she had another episode after she had just fallen asleep. She woke up with her belly feeling a crunch, there was a sensation in her belly that traveled outward. She felt an electric sensation that  she has had intermittently occurring since her 68s. She reports that when she first took Effexor for 3 weeks in her 99s, she had electric shocks in her head after stopping medication. Every time she is sick or when she wakes up with these episodes, she would feel these electric sensations. It feels like everything would "shut off for a second," she feels a sensation leaving her a little slurry. She felt this way for a whole day after the episodes. These are still happening but not as frequently. Last weekend, she was laying in bed when her left arm felt a little jittery, then right right leg had low amplitude shaking. It felt more intense and she felt the stomach crunch and electricity feeling briefly.Sometimes she has the electric feeling in her head during the day. She had COVID in September, a few days prior to the first episode, and recalls that when she was sick, she had a nightmare and was emotional, waking up unable to breathe. She sleeps 6 hours at night and wakes up feeling tired. She never feels like she has enough sleep. She had a sleep study in 2006 due to daytime drowsiness, diagnosed with periodic limb movements. She reports feeling "out of it a lot." She would be driving for 10 minutes and does not remember the last 2 minutes driving down the road. She is a Agricultural consultant and notes that when there is a glare, "it messes with me."  A few days ago, her husband woke up at night and told her she was having nightmares where she could not breathe in her sleep, he looked over and saw her grabbing her throat making a weird sound. Her eyes were open but she was not awake and she was not responding to him.   Since all of this, she has been having headaches localized behind her left eye. Sounds in her left ear feel like they are going in and out. She has had headaches behind her left eye for years with sensitivity to lights or sounds. No nausea/vomiting. She was not having much headaches from  2008 until recently, bu now they occur daily and when severe she takes Tylenol/Ibuprofen with minimal effect. She notes a history of severe hyperhidrosis, she has seen Endocrinology and prescribed glycopyrrolate. She reports staring episodes "my whole life." She recalls having weird sensations in her legs when she had Covid, she took Paxlovid. She reports a very stressful traumatic school year the last year at work. Before she got Covid, last March/April 2022, she was talking to her husband and then all of a sudden she could not get any air in. There was no air going in or out, she recalls thinking she was going to die. She lay back on the bed, grabbed her throat then tiny air came in. This was occurring repeatedly so her husband called EMS then she was back to normal. She reports a history of anxiety her whole life. She takes Ativan every 3 months because it makes her very sleepy. 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  A maternal cousin had GBM.She had a normal birth and early development.  There is no history of febrile convulsions, CNS infections such as meningitis/encephalitis, significant traumatic brain injury, neurosurgical procedures, or family history of seizures.  She had a brain MRI without contrast done 08/06/2021 which was normal.   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   Cardiac murmur 09/02/2020   Chest pain 08/04/2018   Chronic interstitial cystitis 08/10/2016   Cigarette smoker 09/02/2020   COVID-19 virus infection 11/2020   Depressive disorder 07/17/2014   DUB (dysfunctional uterine bleeding) 01/10/2015   Dysmenorrhea 01/10/2015   History of recurrent UTIs    Hormone disorder    Hyperhidrosis 12/03/2017   Interstitial cystitis    Menorrhagia with irregular cycle 01/10/2015   Mixed dyslipidemia 09/02/2020   Neck pain 07/19/2018   Palpitations 08/04/2018   Polycystic  ovaries 08/10/2016   Premenstrual dysphoric disorder 01/10/2015   PSVT (paroxysmal supraventricular tachycardia) ([place]) 08/04/2018   Tachycardia 08/04/2018   Tobacco abuse     PAST SURGICAL HISTORY: Past Surgical History:  Procedure Laterality Date   CESAREAN SECTION  x2   INTRAUTERINE DEVICE (IUD) INSERTION  03/2015   OVARIAN CYST REMOVAL  Age 40    MEDICATIONS: Current Outpatient Medications on File Prior to Visit  Medication Sig Dispense Refill   amphetamine-dextroamphetamine (ADDERALL XR) 20 MG 24 hr capsule Take 20 mg by mouth every morning.     LORazepam (ATIVAN) 0.5 MG tablet Take 0.5 mg by mouth as needed for anxiety.  0   metoprolol tartrate (LOPRESSOR) 25 MG tablet Take 25 mg by mouth as needed (blood pressure). (Patient not taking: Reported on 08/27/2021)  2   No current facility-administered medications on file prior to visit.    ALLERGIES: Allergies  Allergen  Reactions   Sulfa Antibiotics     Other reaction(s): GI Intolerance   Abilify [Aripiprazole] Other (See Comments)    dizziness    FAMILY HISTORY: Family History  Problem Relation Age of Onset   Endometriosis Sister    Endometriosis Mother    Stroke Mother    Endometriosis Maternal Aunt    Endometriosis Maternal Aunt    Heart attack Paternal Grandmother    Hypertension Paternal Grandmother    Hypertension Father    Hypertension Sister    Uterine cancer Other        paternal great GM    SOCIAL HISTORY: Social History   Socioeconomic History   Marital status: Married    Spouse name: Not on file   Number of children: Not on file   Years of education: Not on file   Highest education level: Not on file  Occupational History   Not on file  Tobacco Use   Smoking status: Former    Packs/day: 0.25    Years: 20.00    Pack years: 5.00    Types: Cigarettes   Smokeless tobacco: Never  Vaping Use   Vaping Use: Never used  Substance and Sexual Activity   Alcohol use: Yes    Alcohol/week: 3.0 - 4.0  standard drinks    Types: 3 - 4 Standard drinks or equivalent per week    Comment: occ   Drug use: No   Sexual activity: Yes    Partners: Male    Birth control/protection: Surgical, I.U.D.    Comment: Vasectomy/Mirena IUD inserted 03/2015  Other Topics Concern   Not on file  Social History Narrative   Right handed    Social Determinants of Health   Financial Resource Strain: Not on file  Food Insecurity: Not on file  Transportation Needs: Not on file  Physical Activity: Not on file  Stress: Not on file  Social Connections: Not on file  Intimate Partner Violence: Not on file     PHYSICAL EXAM: Vitals:   08/27/21 1027  BP: 126/83  Pulse: 86  SpO2: 100%   General: No acute distress Head:  Normocephalic/atraumatic Skin/Extremities: No rash, no edema Neurological Exam: Mental status: alert and oriented to person, place, and time, no dysarthria or aphasia, Fund of knowledge is appropriate.  Recent and remote memory are intact, 3/3 delayed recall.  Attention and concentration are normal, 5/5 WORLD backwards.  Cranial nerves: CN I: not tested CN II: pupils equal, round and reactive to light, visual fields intact CN III, IV, VI:  full range of motion, no nystagmus, no ptosis CN V: facial sensation intact CN VII: upper and lower face symmetric CN VIII: hearing intact to conversation Bulk & Tone: normal, no fasciculations. Motor: 5/5 throughout with no pronator drift. Sensation: intact to light touch, cold, pin, vibration sense.  No extinction to double simultaneous stimulation.  Romberg test negative Deep Tendon Reflexes: +2 throughout Cerebellar: no incoordination on finger to nose, heel to shin. No dysdiadochokinesia Gait: narrow-based and steady, able to tandem walk adequately. Tremor: none   IMPRESSION: This is a 45 year old right-handed woman with a history of anxiety, ADD, presenting for evaluation of nocturnal episodes of body posturing. She also reports electrical  sensations where everything would "shut off" briefly. She has also been having episodes where she is holding on to her throat where she feels she cannot breathe. Her neurological exam and brain MRI are normal. Etiology of symptoms unclear, they are not typical of seizures, we discussed  doing a 1-hour EEG and if normal, a 72-hour EEG will be done to characterize her symptoms. If normal, we will do a sleep study. I am concerned anxiety (?PTSD) may be playing a role, advised continued management with PCP and/or Behavioral Health. Follow-up after tests, she knows to call for any changes.    Thank you for allowing me to participate in the care of this patient. Please do not hesitate to call for any questions or concerns.   Ellouise Newer, M.D.  CC: Dr. Delena Bali

## 2021-09-02 ENCOUNTER — Other Ambulatory Visit: Payer: Self-pay

## 2021-09-02 ENCOUNTER — Encounter: Payer: Self-pay | Admitting: Neurology

## 2021-09-02 ENCOUNTER — Ambulatory Visit: Payer: BC Managed Care – PPO | Admitting: Neurology

## 2021-09-02 DIAGNOSIS — R29818 Other symptoms and signs involving the nervous system: Secondary | ICD-10-CM

## 2021-09-03 ENCOUNTER — Telehealth: Payer: Self-pay | Admitting: Neurology

## 2021-09-03 NOTE — Telephone Encounter (Signed)
Pt said she would like a call regarding her results from her EEG

## 2021-09-04 NOTE — Telephone Encounter (Signed)
Patient called again for EEG results. She is wanting to view them in [place].

## 2021-09-04 NOTE — Telephone Encounter (Signed)
Pt called an informed that results are not released yet and that we will call her once we have them.

## 2021-09-04 NOTE — Telephone Encounter (Signed)
Pls let her know the EEG was normal, however it is not like a pregnancy test that is positive or negative, just a snap shot of her brain waves during the test. Would proceed with the 72-hour EEG as discussed, thanks

## 2021-09-04 NOTE — Procedures (Signed)
ELECTROENCEPHALOGRAM REPORT  Date of Study: 09/02/2021  Patient's Name: Courtney Goodwin MRN: [id number] Date of Birth: 1976-08-23  Referring Provider: Dr. Ellouise Newer  Clinical History: This is a 45 year old woman with nocturnal episodes of body contracting to fetal position, electric sensations in her head. EEG for classification.  Medications: ADDERALL XR 20 MG 24 hr capsule ATIVAN 0.5 MG tablet  Technical Summary: A multichannel digital 1-hour EEG recording measured by the international 10-20 system with electrodes applied with paste and impedances below 5000 ohms performed in our laboratory with EKG monitoring in an awake and asleep patient.  Hyperventilation was not performed. Photic stimulation was performed.  The digital EEG was referentially recorded, reformatted, and digitally filtered in a variety of bipolar and referential montages for optimal display.    Description: The patient is awake and asleep during the recording.  During maximal wakefulness, there is a symmetric, medium voltage 10 Hz posterior dominant rhythm that attenuates with eye opening.  The record is symmetric.  During drowsiness and sleep, there is an increase in theta slowing of the background.  Vertex waves and symmetric sleep spindles were seen.  Photic stimulation did not elicit any abnormalities.  There were no epileptiform discharges or electrographic seizures seen.    EKG lead was unremarkable.  Impression: This 1-hour awake and asleep EEG is normal.    Clinical Correlation: A normal EEG does not exclude a clinical diagnosis of epilepsy.  If further clinical questions remain, prolonged EEG may be helpful.  Clinical correlation is advised.   Ellouise Newer, M.D.

## 2021-09-05 NOTE — Telephone Encounter (Signed)
Pt called to go over EEG results no answer left a voice mail to call the office back

## 2021-09-08 ENCOUNTER — Other Ambulatory Visit: Payer: BC Managed Care – PPO

## 2021-09-08 NOTE — Telephone Encounter (Signed)
Pt called to go over EEG results no answer left a voice mail to call the office back

## 2021-09-09 NOTE — Telephone Encounter (Signed)
Pt called no answer per DPR left a voice mail EEG was normal, however it is not like a pregnancy test that is positive or negative, just a snap shot of her brain waves during the test. Dr Delice Lesch Would like for her to  proceed with the 72-hour EEG as discussed

## 2021-09-12 ENCOUNTER — Ambulatory Visit: Payer: Self-pay | Admitting: Neurology

## 2021-09-12 ENCOUNTER — Other Ambulatory Visit: Payer: BC Managed Care – PPO

## 2021-10-21 ENCOUNTER — Ambulatory Visit: Payer: BC Managed Care – PPO | Admitting: Neurology

## 2021-10-21 ENCOUNTER — Other Ambulatory Visit: Payer: Self-pay

## 2021-10-21 DIAGNOSIS — R29818 Other symptoms and signs involving the nervous system: Secondary | ICD-10-CM

## 2021-11-04 ENCOUNTER — Telehealth: Payer: Self-pay

## 2021-11-04 NOTE — Telephone Encounter (Signed)
-----   Message from Cameron Sprang, MD sent at 11/04/2021  1:22 PM EST ----- Regarding: EEG diary Manuela Schwartz said she was going to send me a typed document on MyChart of her push button events, can you pls call her and have her send them, thanks!  ----- Message ----- From: Azalee Course Sent: 10/23/2021   1:40 PM EST To: Cameron Sprang, MD Subject: EEG to read                                    Her 48 hour ambulatory EEG is ready to read.  She started to copy her diary notes from her phone to her diary, she had a lot of button presses, then decided to send them to you via a typed document in [place]. She had button presses in the middle of the night and was going to remember what time, then write the description later. She cannot remember what time so asked that you do not disregard those button presses as accidental they were intentional even though there is no description of the symptoms.

## 2021-11-04 NOTE — Telephone Encounter (Signed)
Pt called no answer voice mail full when she calls back we need the push button events from her EEG

## 2021-11-04 NOTE — Procedures (Signed)
ELECTROENCEPHALOGRAM REPORT  Dates of Recording: 10/21/2021 11:31AM to 10/23/2021 11:37AM  Patient's Name: Courtney Goodwin MRN: [id number] Date of Birth: 11/27/1975  Referring Provider: Dr. Ellouise Newer  Procedure: 48-hour ambulatory video EEG  History: This is a 46 year old woman with nocturnal episodes of body contracting to fetal position, electric sensations in her head. EEG for classification.   Medications: ADDERALL XR 20 MG 24 hr capsule ATIVAN 0.5 MG tablet  Technical Summary: This is a 48-hour multichannel digital video EEG recording measured by the international 10-20 system with electrodes applied with paste and impedances below 5000 ohms performed as portable with EKG monitoring.  The digital EEG was referentially recorded, reformatted, and digitally filtered in a variety of bipolar and referential montages for optimal display.    DESCRIPTION OF RECORDING: During maximal wakefulness, the background activity consisted of a symmetric 10 Hz posterior dominant rhythm which was reactive to eye opening.  There were no epileptiform discharges or focal slowing seen in wakefulness.  During the recording, the patient progresses through wakefulness, drowsiness, and Stage 2 sleep.  Again, there were no epileptiform discharges seen.  Events: On 12/20 at 1819 hours, she has a weird pain on left side of head/eye. Patient not on video. Electrographically, there were no EEG or EKG changes seen.  On 12/20 at 2052 hours, she felt a little off from fluorescent lights at [facility]. Patient not on video. Electrographically, there were no EEG or EKG changes seen.  On 12/21 at 0102 hours, she has left ear ringing. Patient is sitting on recliner with no clinical changes seen. Electrographically, there were no EEG or EKG changes seen.  On 12/21 at 0131 hours, she felt nauseated and tired, involuntary extra deep breath at 0132 hours. Patient not on video. Electrographically, there were no EEG or EKG  changes seen.  She pressed the button in sleep times, did not track times in half-asleep state but noted symptoms of hearing noise that wasn't real, like a loud spinning top; felt a jerk in her abdomen; heard noise that wasn't real like a loud old fashioned metal spinning to spinning with sharp pain in right eye; after awakening bag of worms back of calves and ankles, hoarse once awake. Push button events at night reviewed, no clinical changes seen on video. Electrographically, there were no EEG or EKG changes seen.  On 12/21 at 1020 hours, reading and arm can't hold up phone, anxiety increasing. Patient not on video. Electrographically, there were no EEG or EKG changes seen.  On 12/21 at 1350 hours, she has brain fog, could not remember a simple word. Patient sitting up on bed, no clinical changes seen. Electrographically, there were no EEG or EKG changes seen.  On 12/21 at 1502 hours, she had a weird sensation on top left crwon of head like cool water being poured in the area. Patient sitting up on bed, no clinical changes seen. Electrographically, there were no EEG or EKG changes seen.  On 12/21 at 1520 hours, she had pain on left side of head from behind the ear to behind the eye. Patient lying in bed, no clinical changes seen. Electrographically, there were no EEG or EKG changes seen.  On 12/21 at 1609 and 1614, she reported "head shock." Patient lying on stomach in bed, no clinical changes seen. Electrographically, there were no EEG or EKG changes seen.  On 12/21 at 1755 hours, she had mild "head shock and heart pounding in head. Patient not on video. Electrographically, there were no  EEG or EKG changes seen.  On 12/21 at 1802 hours, she felt heart pounding and pulsating in right ear. Patient not on video. Electrographically, there were no EEG or EKG changes seen.  On 12/21 at 2040 hours, she felt weak, heart pounding throughout body. Patient not on video. Electrographically, there were no  EEG or EKG changes seen.  On 12/21 at 2100 she felt very weak and could barely stand. Patient not on video. Electrographically, there were no EEG or EKG changes seen.  There were no electrographic seizures seen.  EKG lead was unremarkable.  IMPRESSION: This 48-hour ambulatory video EEG study is normal.    CLINICAL CORRELATION: A normal EEG does not exclude a clinical diagnosis of epilepsy. Typical events were not captured. Episodes of head pain, feeling off, ear ringing/pounding, sensory symptoms, brain fog, did not show any EEG correlate.  If further clinical questions remain, inpatient video EEG monitoring may be helpful.   Ellouise Newer, M.D.

## 2021-11-05 ENCOUNTER — Encounter: Payer: Self-pay | Admitting: Neurology

## 2021-11-05 NOTE — Telephone Encounter (Signed)
°  °  Pt called no answer voice mail full when she calls back we need the push button events from her EEG

## 2021-11-05 NOTE — Telephone Encounter (Signed)
My chart message sent to pt.

## 2021-11-06 ENCOUNTER — Encounter: Payer: Self-pay | Admitting: Neurology

## 2021-12-12 ENCOUNTER — Ambulatory Visit: Payer: BC Managed Care – PPO | Admitting: Obstetrics and Gynecology

## 2021-12-12 ENCOUNTER — Other Ambulatory Visit: Payer: Self-pay

## 2021-12-12 ENCOUNTER — Encounter: Payer: Self-pay | Admitting: Obstetrics and Gynecology

## 2021-12-12 VITALS — BP 110/74 | HR 90 | Resp 16 | Wt 139.0 lb

## 2021-12-12 DIAGNOSIS — R5383 Other fatigue: Secondary | ICD-10-CM

## 2021-12-12 DIAGNOSIS — R232 Flushing: Secondary | ICD-10-CM | POA: Diagnosis not present

## 2021-12-12 DIAGNOSIS — N898 Other specified noninflammatory disorders of vagina: Secondary | ICD-10-CM

## 2021-12-12 LAB — WET PREP FOR TRICH, YEAST, CLUE

## 2021-12-12 NOTE — Progress Notes (Signed)
GYNECOLOGY  VISIT   HPI: 46 y.o.   Married   Declined White or Caucasian Not Hispanic or Latino  female   [phone] with No LMP recorded. (Menstrual status: IUD).  Mirena placed in 5/16.  here for vaginal odor & itching.  She c/o a severe odor for the last few days. The odor seems to be around her c/s scar and vagina.  Mild itching.  Some black spotting.  H/O constipation, h/o bleeding hemorrhoids. Some fecal incontinence of pus/mucous/blood. Recently saw GI MD, ordered a colonoscopy. She had several ulcerations throughout her colon. Biopsy results pending.  Everything got really bad after she got covid last fall.   She has been having seizure like activity, has seen Neurology and had 2 EMG's. Lost consciousness.   She is constantly sweating, also getting hot flashes.   She has dizziness, fatigue, weakness.  Wakes up with bilateral leg pain, worsens throughout the day. By the end of the day she can barely walk.   H/O C/S, was told she had extensive adhesions.   GYNECOLOGIC HISTORY: No LMP recorded. (Menstrual status: IUD). Contraception:husband vasectomy, mirena  inserted 03-07-15 Menopausal hormone therapy: none        OB History     Gravida  2   Para  2   Term  2   Preterm      AB      Living  2      SAB      IAB      Ectopic      Multiple      Live Births                 Patient Active Problem List   Diagnosis Date Noted   Bicuspid aortic valve 12/03/2020   Anemia    History of recurrent UTIs    Hormone disorder    Interstitial cystitis    Tobacco abuse    COVID-19 virus infection 11/2020   Mixed dyslipidemia 09/02/2020   Cigarette smoker 09/02/2020   Cardiac murmur 09/02/2020   Chest pain 08/04/2018   Palpitations 08/04/2018   Tachycardia 08/04/2018   PSVT (paroxysmal supraventricular tachycardia) ([place]) 08/04/2018   Neck pain 07/19/2018   Hyperhidrosis 12/03/2017   Anxiety 08/10/2016   Chronic interstitial cystitis 08/10/2016    Polycystic ovaries 08/10/2016   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   Premenstrual dysphoric disorder 01/10/2015   Dysmenorrhea 01/10/2015   DUB (dysfunctional uterine bleeding) 01/10/2015   Menorrhagia with irregular cycle 01/10/2015   Depressive disorder 07/17/2014    Past Medical History:  Diagnosis Date   Anemia    Anxiety    Anxiety 08/10/2016   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   Cardiac murmur 09/02/2020   Chest pain 08/04/2018   Chronic interstitial cystitis 08/10/2016   Cigarette smoker 09/02/2020   COVID-19 virus infection 11/2020   Depressive disorder 07/17/2014   DUB (dysfunctional uterine bleeding) 01/10/2015   Dysmenorrhea 01/10/2015   History of recurrent UTIs    Hormone disorder    Hyperhidrosis 12/03/2017   Interstitial cystitis    Menorrhagia with irregular cycle 01/10/2015   Mixed dyslipidemia 09/02/2020   Neck pain 07/19/2018   Palpitations 08/04/2018   Polycystic ovaries 08/10/2016   Premenstrual dysphoric disorder 01/10/2015   PSVT (paroxysmal supraventricular tachycardia) ([place]) 08/04/2018   Tachycardia 08/04/2018   Tobacco abuse     Past Surgical History:  Procedure Laterality Date   CESAREAN SECTION  x2   INTRAUTERINE DEVICE (IUD) INSERTION  03/2015   OVARIAN CYST REMOVAL  Age 41    Current Outpatient Medications  Medication Sig Dispense Refill   amphetamine-dextroamphetamine (ADDERALL XR) 20 MG 24 hr capsule Take 20 mg by mouth every morning.     glycopyrrolate (ROBINUL) 1 MG tablet Take 1 mg by mouth as needed.     LORazepam (ATIVAN) 0.5 MG tablet Take 0.5 mg by mouth as needed for anxiety.  0   metoprolol tartrate (LOPRESSOR) 25 MG tablet Take 25 mg by mouth as needed (blood pressure).  2   traZODone (DESYREL) 150 MG tablet Take 150 mg by mouth as needed.     No current facility-administered medications for this visit.     ALLERGIES: Sulfa antibiotics and Abilify [aripiprazole]  Family History  Problem Relation Age of Onset   Endometriosis Sister     Endometriosis Mother    Stroke Mother    Endometriosis Maternal Aunt    Endometriosis Maternal Aunt    Heart attack Paternal Grandmother    Hypertension Paternal Grandmother    Hypertension Father    Hypertension Sister    Uterine cancer Other        paternal great GM    Social History   Socioeconomic History   Marital status: Married    Spouse name: Not on file   Number of children: Not on file   Years of education: Not on file   Highest education level: Not on file  Occupational History   Not on file  Tobacco Use   Smoking status: Former    Packs/day: 0.25    Years: 20.00    Pack years: 5.00    Types: Cigarettes   Smokeless tobacco: Never  Vaping Use   Vaping Use: Never used  Substance and Sexual Activity   Alcohol use: Not Currently   Drug use: Never   Sexual activity: Not Currently    Partners: Male    Birth control/protection: Surgical, I.U.D.    Comment: Vasectomy/Mirena IUD inserted 03/2015  Other Topics Concern   Not on file  Social History Narrative   Right handed    Social Determinants of Health   Financial Resource Strain: Not on file  Food Insecurity: Not on file  Transportation Needs: Not on file  Physical Activity: Not on file  Stress: Not on file  Social Connections: Not on file  Intimate Partner Violence: Not on file    Review of Systems  Constitutional: Negative.   HENT: Negative.    Eyes: Negative.   Respiratory: Negative.    Cardiovascular: Negative.   Gastrointestinal: Negative.   Genitourinary:        Vaginal odor & itching  Musculoskeletal: Negative.   Skin: Negative.   Neurological: Negative.   Endo/Heme/Allergies: Negative.   Psychiatric/Behavioral: Negative.     PHYSICAL EXAMINATION:    BP 110/74    Pulse 90    Resp 16    Wt 139 lb (63 kg)    BMI 26.26 kg/m     General appearance: alert, cooperative and appears stated age Abdomen: soft, mildly tender in the epigastric region and LLQ, no rebound, no guarding; non  distended, no masses,  no organomegaly  Pelvic: External genitalia:  no lesions              Urethra:  normal appearing urethra with no masses, tenderness or lesions              Bartholins and Skenes: normal                 Vagina:  normal appearing vagina with normal color and discharge, no lesions              Cervix: no lesions and IUD string 4 cm               Chaperone was present for exam.  1. Vaginal odor - WET PREP FOR TRICH, YEAST, CLUE: negative - SureSwab Advanced Vaginitis, TMA -She is being evaluated for passage of anal pus/blood, awaiting biopsies from recent colonoscopy where ulcerations were noted.  2. Hot flashes - Follicle stimulating hormone - CBC - TSH -She has other symptoms, including fatigue, seizure like activity and leg pain (worsens throughout the day)  3. Other fatigue - CBC - Comprehensive metabolic panel - TSH  Addendum: F/u for annual exam and pap in 8/23

## 2021-12-13 LAB — COMPREHENSIVE METABOLIC PANEL
AG Ratio: 2 (calc) (ref 1.0–2.5)
ALT: 9 U/L (ref 6–29)
AST: 13 U/L (ref 10–30)
Albumin: 4.5 g/dL (ref 3.6–5.1)
Alkaline phosphatase (APISO): 54 U/L (ref 31–125)
BUN: 10 mg/dL (ref 7–25)
CO2: 26 mmol/L (ref 20–32)
Calcium: 9.8 mg/dL (ref 8.6–10.2)
Chloride: 105 mmol/L (ref 98–110)
Creat: 0.85 mg/dL (ref 0.50–0.99)
Globulin: 2.3 g/dL (calc) (ref 1.9–3.7)
Glucose, Bld: 90 mg/dL (ref 65–99)
Potassium: 4.6 mmol/L (ref 3.5–5.3)
Sodium: 137 mmol/L (ref 135–146)
Total Bilirubin: 0.6 mg/dL (ref 0.2–1.2)
Total Protein: 6.8 g/dL (ref 6.1–8.1)

## 2021-12-13 LAB — CBC
HCT: 43.9 % (ref 35.0–45.0)
Hemoglobin: 14.8 g/dL (ref 11.7–15.5)
MCH: 30.3 pg (ref 27.0–33.0)
MCHC: 33.7 g/dL (ref 32.0–36.0)
MCV: 90 fL (ref 80.0–100.0)
MPV: 9.7 fL (ref 7.5–12.5)
Platelets: 370 10*3/uL (ref 140–400)
RBC: 4.88 10*6/uL (ref 3.80–5.10)
RDW: 11.5 % (ref 11.0–15.0)
WBC: 5.3 10*3/uL (ref 3.8–10.8)

## 2021-12-13 LAB — TSH: TSH: 1.85 mIU/L

## 2021-12-13 LAB — FOLLICLE STIMULATING HORMONE: FSH: 5.7 m[IU]/mL

## 2021-12-15 LAB — SURESWAB® ADVANCED VAGINITIS,TMA
CANDIDA SPECIES: NOT DETECTED
Candida glabrata: NOT DETECTED
SURESWAB(R) ADV BACTERIAL VAGINOSIS(BV),TMA: NEGATIVE
TRICHOMONAS VAGINALIS (TV),TMA: NOT DETECTED

## 2021-12-17 ENCOUNTER — Encounter: Payer: Self-pay | Admitting: Neurology

## 2022-01-06 ENCOUNTER — Encounter: Payer: Self-pay | Admitting: Obstetrics and Gynecology

## 2022-01-20 DIAGNOSIS — Z87898 Personal history of other specified conditions: Secondary | ICD-10-CM | POA: Insufficient documentation

## 2022-01-20 DIAGNOSIS — Z8719 Personal history of other diseases of the digestive system: Secondary | ICD-10-CM

## 2022-01-20 HISTORY — DX: Personal history of other specified conditions: Z87.898

## 2022-01-20 HISTORY — DX: Personal history of other diseases of the digestive system: Z87.19

## 2022-02-17 ENCOUNTER — Ambulatory Visit (INDEPENDENT_AMBULATORY_CARE_PROVIDER_SITE_OTHER): Payer: BC Managed Care – PPO | Admitting: Cardiology

## 2022-02-17 ENCOUNTER — Other Ambulatory Visit: Payer: Self-pay

## 2022-02-17 ENCOUNTER — Ambulatory Visit: Payer: BC Managed Care – PPO

## 2022-02-17 VITALS — BP 130/82 | HR 103 | Ht 61.0 in | Wt 141.4 lb

## 2022-02-17 DIAGNOSIS — R55 Syncope and collapse: Secondary | ICD-10-CM | POA: Insufficient documentation

## 2022-02-17 DIAGNOSIS — R141 Gas pain: Secondary | ICD-10-CM | POA: Insufficient documentation

## 2022-02-17 DIAGNOSIS — Q231 Congenital insufficiency of aortic valve: Secondary | ICD-10-CM | POA: Diagnosis not present

## 2022-02-17 DIAGNOSIS — E782 Mixed hyperlipidemia: Secondary | ICD-10-CM | POA: Diagnosis not present

## 2022-02-17 DIAGNOSIS — K5190 Ulcerative colitis, unspecified, without complications: Secondary | ICD-10-CM | POA: Insufficient documentation

## 2022-02-17 DIAGNOSIS — Q2381 Bicuspid aortic valve: Secondary | ICD-10-CM | POA: Insufficient documentation

## 2022-02-17 DIAGNOSIS — E320 Persistent hyperplasia of thymus: Secondary | ICD-10-CM

## 2022-02-17 DIAGNOSIS — K625 Hemorrhage of anus and rectum: Secondary | ICD-10-CM

## 2022-02-17 DIAGNOSIS — K623 Rectal prolapse: Secondary | ICD-10-CM | POA: Insufficient documentation

## 2022-02-17 DIAGNOSIS — F988 Other specified behavioral and emotional disorders with onset usually occurring in childhood and adolescence: Secondary | ICD-10-CM | POA: Insufficient documentation

## 2022-02-17 DIAGNOSIS — R079 Chest pain, unspecified: Secondary | ICD-10-CM | POA: Insufficient documentation

## 2022-02-17 DIAGNOSIS — R194 Change in bowel habit: Secondary | ICD-10-CM

## 2022-02-17 DIAGNOSIS — I471 Supraventricular tachycardia: Secondary | ICD-10-CM

## 2022-02-17 DIAGNOSIS — K5120 Ulcerative (chronic) proctitis without complications: Secondary | ICD-10-CM | POA: Insufficient documentation

## 2022-02-17 DIAGNOSIS — R1084 Generalized abdominal pain: Secondary | ICD-10-CM

## 2022-02-17 DIAGNOSIS — R140 Abdominal distension (gaseous): Secondary | ICD-10-CM | POA: Insufficient documentation

## 2022-02-17 DIAGNOSIS — K5900 Constipation, unspecified: Secondary | ICD-10-CM | POA: Insufficient documentation

## 2022-02-17 DIAGNOSIS — R601 Generalized edema: Secondary | ICD-10-CM | POA: Insufficient documentation

## 2022-02-17 HISTORY — DX: Ulcerative (chronic) proctitis without complications: K51.20

## 2022-02-17 HISTORY — DX: Gas pain: R14.1

## 2022-02-17 HISTORY — DX: Hemorrhage of anus and rectum: K62.5

## 2022-02-17 HISTORY — DX: Change in bowel habit: R19.4

## 2022-02-17 HISTORY — DX: Generalized abdominal pain: R10.84

## 2022-02-17 HISTORY — DX: Rectal prolapse: K62.3

## 2022-02-17 HISTORY — DX: Constipation, unspecified: K59.00

## 2022-02-17 NOTE — Patient Instructions (Addendum)
Medication Instructions:  ?Your physician recommends that you continue on your current medications as directed. Please refer to the Current Medication list given to you today. ? ?*If you need a refill on your cardiac medications before your next appointment, please call your pharmacy* ? ? ?Lab Work: ?NONE ?If you have labs (blood work) drawn today and your tests are completely normal, you will receive your results only by: ?MyChart Message (if you have MyChart) OR ?A paper copy in the mail ?If you have any lab test that is abnormal or we need to change your treatment, we will call you to review the results. ? ? ?Testing/Procedures: ?Your physician has requested that you have an echocardiogram. Echocardiography is a painless test that uses sound waves to create images of your heart. It provides your doctor with information about the size and shape of your heart and how well your heart?s chambers and valves are working. This procedure takes approximately one hour. There are no restrictions for this procedure.  ?To be done at [facility] ? ?You have been given a 2 week Zio Heart Monitor today. Remove on May 2nd and mail back in box provided. If any questions about the monitor please call the conpany [phone] ? ? ?Follow-Up: ?At [facility], you and your health needs are our priority.  As part of our continuing mission to provide you with exceptional heart care, we have created designated Provider Care Teams.  These Care Teams include your primary Cardiologist (physician) and Advanced Practice Providers (APPs -  Physician Assistants and Nurse Practitioners) who all work together to provide you with the care you need, when you need it. ? ?We recommend signing up for the patient portal called "MyChart".  Sign up information is provided on this After Visit Summary.  MyChart is used to connect with patients for Virtual Visits (Telemedicine).  Patients are able to view lab/test results, encounter notes,  upcoming appointments, etc.  Non-urgent messages can be sent to your provider as well.   ?To learn more about what you can do with MyChart, go to [email or web address].   ? ?Your next appointment:   ?9 month(s) ? ?The format for your next appointment:   ?In Person ? ?Provider:   ?Jyl Heinz, MD  ? ? ?Other Instructions ?Step One- Record your EKG strip on [facility] app.  ? ?Step two- On Kardia EKG click ?Download?  ? ?Step three- It will prompt you to make a password for this EKG. Please make the password ?Bettina Gavia? so that we can view it.  ? ?Step four- Click on the little ?upload? button (small box with an arrow in the middle) in the bottom left-hand corner of the screen.  ? ?Step five- Click ?Save to Files? ? ?Step six- Click on ?On my iphone? and then ?Pages? then press save in the top right-hand corner.  ? ?NOW GO TO Alfonse Spruce App to be downloaded to phone ? ?Once on MyChart click ?Messages? ? ?Step one- Click ?Send a message? ? ?Step two- Click ?Ask a medical question?  ? ?Step three- Click ?Non urgent medical question?  ? ?Step four- Click on [facility] name. ? ?Step five- Click on the small paperclip at the bottom of the screen ? ?Step six- Click ?Choose file? ? ?Step seven- Pick the most recent EKG strip listed.  ? ?Once uploaded send the message!  ? ? ?FDA-cleared personal EKG: The world?s most clinically validated personal EKG, FDA-cleared to detect Atrial Fibrillation, Bradycardia, and Tachycardia.  Evalee Mutton is the most reliable way to check in on your heart from home. ?Take your EKG from anywhere: Capture a medical-grade EKG in 30 seconds and get an instant analysis right on your smartphone. Evalee Mutton is small enough to fit in your pocket, so you can take it with you anywhere. ?Easy to use: Simply place your fingers on the sensors--no wires, patches, or gels. ?Recommended by doctors: A trusted resource, Evalee Mutton is the #1 doctor-recommended personal EKG with more than 100  million EKGs recorded. ?Save or share your EKGs: With the press of a button, email your EKGs to your doctor or save them on your phone. ?Works with smartphones: Compatible with [employer] manager and tablets. Check our compatibility chart. ?FSA/HSA eligible: Purchase using an FSA or HSA account (please confirm coverage with your insurance provider). ?Phone clip included with purchase, a $15 value. Conveniently take your device with you wherever you go. ? ?https://store.[email or web address].tn ? ? ? ?Important Information About Sugar ? ? ? ? ?  ?

## 2022-02-17 NOTE — Addendum Note (Signed)
Addended by: Jerl Santos R on: 02/17/2022 02:47 PM ? ? Modules accepted: Orders ? ?

## 2022-02-17 NOTE — Progress Notes (Signed)
?Cardiology Office Note:   ? ?Date:  02/17/2022  ? ?ID:  Courtney Goodwin, DOB October 03, 1976, MRN [id number] ? ?PCP:  Nicoletta Dress, MD  ?Cardiologist:  Jenean Lindau, MD  ? ?Referring MD: Nicoletta Dress, MD  ? ? ?ASSESSMENT:   ? ?1. Bicuspid aortic valve   ?2. PSVT (paroxysmal supraventricular tachycardia) ([place])   ?3. Chest pain, unspecified type   ?4. Mixed dyslipidemia   ?5. 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 ?PLAN:   ? ?In order of problems listed above: ? ?Primary prevention stressed with the patient.  Importance of compliance with diet and medication stressed and she vocalized understanding. ?Chest pain: Atypical in nature however in view of risk factors we will do a exercise stress echo and she is agreeable. ?Palpitations: Probably paroxysmal supraventricular tachycardia.  Not caught on this evaluation.  We will do a 2-week monitor.  Her TSH is normal.  I discussed with her about using a cardia machine and keeping it handy for such episodes to document the rhythm.  She is agreeable. ?Ascending aortic dilatation: CT scan was reviewed and discussed with the patient at this time.  Stable. ?Thymic hyperplasia: Patient has appointment with thoracic surgeon on Friday and she will take recommendations from them. ?Mixed dyslipidemia: Diet was emphasized.  Lipids reviewed.  She is a ex-smoker and quit more than a year ago and I congratulated her about this. ?Patient will be seen in follow-up appointment in 6 months or earlier if the patient has any concerns ? ? ? ?Medication Adjustments/Labs and Tests Ordered: ?Current medicines are reviewed at length with the patient today.  Concerns regarding medicines are outlined above.  ?No orders of the defined types were placed in this encounter. ? ?No orders of the defined types were placed in this encounter. ? ? ? ?No chief complaint on file. ?  ? ?History of Present Illness:   ? ?Courtney Goodwin is a 46 y.o. female.  Patient has past medical history of mixed  dyslipidemia, paroxysmal SVT and went to the hospital with palpitations.  She had chest pain and subsequently.  No orthopnea or PND.  CT scan revealed thymic hyperplasia and an ascending aorta of 3.9 cm.  Subsequently she was discharged and sent home.  She denies any problems at this time and takes care of activities of daily living.  No chest pain orthopnea or PND.  At the time of my evaluation, the patient is alert awake oriented and in no distress. ? ?Past Medical History:  ?Diagnosis Date  ? Abdominal bloating   ? ADD (attention deficit disorder)   ? Anemia   ? Anxiety   ? Anxiety 08/10/2016  ? Bicuspid aortic valve 12/03/2020  ? 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 Cardiac murmur 09/02/2020  ? Change in bowel habit 02/17/2022  ? Chest pain 08/04/2018  ? Chest pain in adult   ? Chronic interstitial cystitis 08/10/2016  ? 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 Cigarette smoker 09/02/2020  ? Congenital bicuspid aortic valve   ? Constipation 02/17/2022  ? COVID-19 virus infection 11/2020  ? Depressive disorder 07/17/2014  ? DUB (dysfunctional uterine bleeding) 01/10/2015  ? Dysmenorrhea 01/10/2015  ? Flatulence, eructation and gas pain 02/17/2022  ? Generalized abdominal pain 02/17/2022  ? Generalized edema   ? History of diarrhea 01/20/2022  ? History of proctitis 01/20/2022  ? History of recurrent UTIs   ? Hormone disorder   ? Hyperhidrosis 12/03/2017  ? Hyperplasia of thymus ([place])   ?  Interstitial cystitis   ? Menorrhagia with irregular cycle 01/10/2015  ? Mixed dyslipidemia 09/02/2020  ? Near syncope   ? Neck pain 07/19/2018  ? 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 Palpitations 08/04/2018  ? Palpitations   ? PLMD (periodic limb movement disorder) 08/23/2006  ? Polycystic ovaries 08/10/2016  ? Premenstrual dysphoric disorder 01/10/2015  ? PSVT (paroxysmal supraventricular tachycardia) ([place]) 08/04/2018  ? Rectal bleeding 02/17/2022  ? Rectal prolapse 02/17/2022  ? Tachycardia 08/04/2018  ? Tobacco abuse   ? Ulcerative colitis  ([place])   ? ? ?Past Surgical History:  ?Procedure Laterality Date  ? CESAREAN SECTION  x2  ? INTRAUTERINE DEVICE (IUD) INSERTION  03/2015  ? OVARIAN CYST REMOVAL  Age 46  ? ? ?Current Medications: ?Current Meds  ?Medication Sig  ? albuterol (VENTOLIN HFA) 108 (90 Base) MCG/ACT inhaler Inhale 1-2 puffs into the lungs every 6 (six) hours as needed for wheezing or shortness of breath.  ? azelastine (ASTELIN) 0.1 % nasal spray Place 1 spray into both nostrils 2 (two) times daily. Use in each nostril as directed  ? glycopyrrolate (ROBINUL) 1 MG tablet Take 1 mg by mouth as needed for other. Peptic ulcers   ? lisdexamfetamine (VYVANSE) 40 MG capsule Take 40 mg by mouth every morning.  ? LORazepam (ATIVAN) 0.5 MG tablet Take 0.5 mg by mouth as needed for anxiety.  ? melatonin 5 MG TABS Take 10 mg by mouth at bedtime.  ? mesalamine (CANASA) 1000 MG suppository Place 1,000 mg rectally as needed for other. Ulcerative colitits  ? traZODone (DESYREL) 150 MG tablet Take 150 mg by mouth as needed for sleep.  ?  ? ?Allergies:   Sulfa antibiotics and Abilify [aripiprazole]  ? ?Social History  ? ?Socioeconomic History  ? Marital status: Married  ?  Spouse name: Not on file  ? Number of children: Not on file  ? Years of education: Not on file  ? Highest education level: Not on file  ?Occupational History  ? Not on file  ?Tobacco Use  ? Smoking status: Former  ?  Packs/day: 0.25  ?  Years: 20.00  ?  Pack years: 5.00  ?  Types: Cigarettes  ? Smokeless tobacco: Never  ?Vaping Use  ? Vaping Use: Never used  ?Substance and Sexual Activity  ? Alcohol use: Not Currently  ? Drug use: Never  ? Sexual activity: Not Currently  ?  Partners: Male  ?  Birth control/protection: Surgical, I.U.D.  ?  Comment: Vasectomy/Mirena IUD inserted 03/2015  ?Other Topics Concern  ? Not on file  ?Social History Narrative  ? Right handed   ? ?Social Determinants of Health  ? ?Financial Resource Strain: Not on file  ?Food Insecurity: Not on file  ?Transportation  Needs: Not on file  ?Physical Activity: Not on file  ?Stress: Not on file  ?Social Connections: Not on file  ?  ? ?Family History: ?The patient's family history includes Endometriosis in her maternal aunt, maternal aunt, mother, and sister; Heart attack in her paternal grandmother; Hypertension in her father, paternal grandmother, and sister; Stroke in her mother; Uterine cancer in an other family member. ? ?ROS:   ?Please see the history of present illness.    ?All other systems reviewed and are negative. ? ?EKGs/Labs/Other Studies Reviewed:   ? ?The following studies were reviewed today: ?I reviewed [place] hospital records.  EKG reveals sinus rhythm and nonspecific ST-T changes ? ? ?Recent Labs: ?12/12/2021: ALT 9; BUN 10; Creat 0.85; Hemoglobin 14.8; Platelets 370;  Potassium 4.6; Sodium 137; TSH 1.85  ?Recent Lipid Panel ?   ?Component Value Date/Time  ? CHOL 192 09/03/2020 0841  ? TRIG 39 09/03/2020 0841  ? HDL 48 09/03/2020 0841  ? CHOLHDL 4.0 09/03/2020 0841  ? CHOLHDL 3.5 06/01/2016 1637  ? VLDL 10 06/01/2016 1637  ? LDLCALC 137 (H) 09/03/2020 0841  ? ? ?Physical Exam:   ? ?VS:  BP 130/82   Pulse (!) 103   Ht '5\' 1"'$  (1.549 m)   Wt 141 lb 6.4 oz (64.1 kg)   SpO2 99%   BMI 26.72 kg/m?    ? ?Wt Readings from Last 3 Encounters:  ?02/17/22 141 lb 6.4 oz (64.1 kg)  ?12/12/21 139 lb (63 kg)  ?08/27/21 139 lb 3.2 oz (63.1 kg)  ?  ? ?GEN: Patient is in no acute distress ?HEENT: Normal ?NECK: No JVD; No carotid bruits ?LYMPHATICS: No lymphadenopathy ?CARDIAC: Hear sounds regular, 2/6 systolic murmur at the apex. ?RESPIRATORY:  Clear to auscultation without rales, wheezing or rhonchi  ?ABDOMEN: Soft, non-tender, non-distended ?MUSCULOSKELETAL:  No edema; No deformity  ?SKIN: Warm and dry ?NEUROLOGIC:  Alert and oriented x 3 ?PSYCHIATRIC:  Normal affect  ? ?Signed, ?Jenean Lindau, MD  ?02/17/2022 2:13 PM    ?[place]  ?

## 2022-02-18 ENCOUNTER — Other Ambulatory Visit: Payer: Self-pay | Admitting: *Deleted

## 2022-02-18 ENCOUNTER — Institutional Professional Consult (permissible substitution): Payer: BC Managed Care – PPO | Admitting: Thoracic Surgery (Cardiothoracic Vascular Surgery)

## 2022-02-18 VITALS — BP 151/99 | HR 84 | Resp 20 | Ht 61.0 in | Wt 141.0 lb

## 2022-02-18 DIAGNOSIS — E320 Persistent hyperplasia of thymus: Secondary | ICD-10-CM

## 2022-02-18 DIAGNOSIS — I7121 Aneurysm of the ascending aorta, without rupture: Secondary | ICD-10-CM

## 2022-02-18 NOTE — Progress Notes (Signed)
PCP is Nicoletta Dress, MD ?Referring Provider is Nicoletta Dress, MD ? ?Chief Complaint  ?Patient presents with  ? Consult  ?  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 ? ?HPI: Mrs. Kister is sent for consultation regarding thymic hyperplasia. ? ?Emmalynn Pinkham is a 46 year old woman with a complicated medical history.  History includes but is not limited to attention deficit disorder, anxiety, bicuspid aortic valve, paroxysmal supraventricular tachycardia, interstitial cystitis, ulcerative proctitis, COVID-19, uterine bleeding, neurofibroma of the skin, hyperhidrosis, polycystic ovarian disease. ? ?Most of her difficulties have happened since she had COVID in September.  She has numerous symptoms including episodes where she feels like she cannot breathe, palpitations, cardiac arrhythmias, dizzy spells, left-sided atypical chest pain, shortness of breath, abdominal pain, constipation, pain in her legs and episodes where legs feel like they are constantly "churning," possible seizures, numbness in her hands, memory problems, blurry vision, hearing loss, decreased appetite, fatigue, electrical type pains in her head and abdomen and anxiety.  She has had an extensive work-up.  Her hemoglobin A1c is normal, ANA was negative, sed rate was 2 creatinine kinase 66, rheumatoid factor < 10.  She recently had an episode of chest pain ? ? ?Past Medical History:  ?Diagnosis Date  ? Abdominal bloating   ? ADD (attention deficit disorder)   ? Anemia   ? Anxiety   ? Anxiety 08/10/2016  ? Bicuspid aortic valve 12/03/2020  ? 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 Cardiac murmur 09/02/2020  ? Change in bowel habit 02/17/2022  ? Chest pain 08/04/2018  ? Chest pain in adult   ? Chronic interstitial cystitis 08/10/2016  ? 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 Cigarette smoker 09/02/2020  ? Congenital bicuspid aortic valve   ? Constipation 02/17/2022  ? COVID-19 virus infection 11/2020  ? Depressive disorder 07/17/2014  ? DUB  (dysfunctional uterine bleeding) 01/10/2015  ? Dysmenorrhea 01/10/2015  ? Flatulence, eructation and gas pain 02/17/2022  ? Generalized abdominal pain 02/17/2022  ? Generalized edema   ? History of diarrhea 01/20/2022  ? History of proctitis 01/20/2022  ? History of recurrent UTIs   ? Hormone disorder   ? Hyperhidrosis 12/03/2017  ? 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 Interstitial cystitis   ? Menorrhagia with irregular cycle 01/10/2015  ? Mixed dyslipidemia 09/02/2020  ? Near syncope   ? Neck pain 07/19/2018  ? 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 Palpitations 08/04/2018  ? Palpitations   ? PLMD (periodic limb movement disorder) 08/23/2006  ? Polycystic ovaries 08/10/2016  ? Premenstrual dysphoric disorder 01/10/2015  ? PSVT (paroxysmal supraventricular tachycardia) ([place]) 08/04/2018  ? Rectal bleeding 02/17/2022  ? Rectal prolapse 02/17/2022  ? Tachycardia 08/04/2018  ? Tobacco abuse   ? Ulcerative colitis ([place])   ? ? ?Past Surgical History:  ?Procedure Laterality Date  ? CESAREAN SECTION  x2  ? INTRAUTERINE DEVICE (IUD) INSERTION  03/2015  ? OVARIAN CYST REMOVAL  Age 72  ? ? ?Family History  ?Problem Relation Age of Onset  ? Endometriosis Sister   ? Endometriosis Mother   ? Stroke Mother   ? Endometriosis Maternal Aunt   ? Endometriosis Maternal Aunt   ? Heart attack Paternal Grandmother   ? Hypertension Paternal Grandmother   ? Hypertension Father   ? Hypertension Sister   ? Uterine cancer Other   ?     paternal great GM  ? ? ?Social History ?Social History  ? ?Tobacco Use  ? Smoking status: Former  ?  Packs/day: 0.25  ?  Years: 20.00  ?  Pack years: 5.00  ?  Types: Cigarettes  ? Smokeless tobacco: Never  ?Vaping Use  ? Vaping Use: Never used  ?Substance Use Topics  ? Alcohol use: Not Currently  ? Drug use: Never  ? ? ?Current Outpatient Medications  ?Medication Sig Dispense Refill  ? lisdexamfetamine (VYVANSE) 40 MG capsule Take 40 mg by mouth every morning.    ? glycopyrrolate (ROBINUL) 1 MG tablet Take 1 mg by mouth as needed for  other. Peptic ulcers  (Patient not taking: Reported on 02/18/2022)    ? LORazepam (ATIVAN) 0.5 MG tablet Take 0.5 mg by mouth as needed for anxiety. (Patient not taking: Reported on 02/18/2022)  0  ? mesalamine (CANASA) 1000 MG suppository Place 1,000 mg rectally as needed for other. Ulcerative colitits (Patient not taking: Reported on 02/18/2022)    ? metoprolol tartrate (LOPRESSOR) 25 MG tablet Take 25 mg by mouth 2 (two) times daily.    ? traZODone (DESYREL) 150 MG tablet Take 150 mg by mouth as needed for sleep. (Patient not taking: Reported on 02/18/2022)    ? ?No current facility-administered medications for this visit.  ? ? ?Allergies  ?Allergen Reactions  ? Sulfa Antibiotics   ?  Other reaction(s): GI Intolerance  ? Abilify [Aripiprazole] Other (See Comments)  ?  dizziness  ? ? ?Review of Systems  ?Constitutional:  Positive for activity change, appetite change and fatigue. Negative for unexpected weight change.  ?HENT:  Positive for hearing loss. Negative for trouble swallowing and voice change.   ?Eyes:  Positive for visual disturbance (Blurry).  ?Respiratory:  Positive for cough and shortness of breath.   ?Cardiovascular:  Positive for chest pain, palpitations and leg swelling.  ?Gastrointestinal:  Positive for abdominal pain, blood in stool and constipation.  ?Musculoskeletal:  Positive for myalgias.  ?Skin:  Positive for rash.  ?     Itching  ?Neurological:  Positive for dizziness, seizures (Possible), speech difficulty, numbness and headaches.  ?     Cognitive difficulties  ?All other systems reviewed and are negative. ? ?BP (!) 151/99 (BP Location: Right Arm, Patient Position: Sitting)   Pulse 84   Resp 20   Ht '5\' 1"'$  (1.549 m)   Wt 141 lb (64 kg)   SpO2 97% Comment: RA  BMI 26.64 kg/m?  ?Physical Exam ?46 year old woman in no acute distress  ?Normal affect, but very anxious ?Alert and oriented x3 with no focal motor deficit ?No thyromegaly ?Lungs clear with equal breath sounds bilaterally ?Cardiac  regular rate and rhythm with 2/6 systolic murmur ?No peripheral edema ? ? ?Diagnostic Tests: ?I reviewed her CT angiogram of the chest done on 02/09/2022 at [facility] ?There is a 3.9 cm aortic root.  There is some soft tissue density in the anterior mediastinum consistent with thymic hyperplasia ? ?Impression: ?Melania Kirks is a 46 year old woman with numerous medical issues and a lot of symptoms which are hard to decipher.  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  Her understanding was that this was the cause of her symptoms. ? ?It really is far more likely that her thymic hyperplasia is reflective of an underlying process than the cause of it. ? ?Her history does not immediately suggest myasthenia gravis.  But she has had an extensive work-up and to this point no one has been able to establish a diagnosis.  I think it is reasonable to check acetylcholine receptor antibody levels.  Even if  those show she has myasthenia, thymectomy would not be the first line of treatment.  That would be medications.  If, in fact, she has myasthenia thymectomy can sometimes make symptoms easier to manage. ? ?Without a better idea of the underlying cause of her symptoms I do not think that it is appropriate to do a thymectomy just hoping it will help. ? ?They have been told that the thymectomy would make her symptoms resolved.  That may well be the case, but I do not think it is the most likely outcome. ? ?She does plan to seek another opinion and I am fine with that and can help her arrange that if she would like. ? 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  Would recommend another CT in a year. ? ?Plan: ?We will check acetylcholine receptor antibody levels ?She will seek another surgical opinion ?I will be happy to see her back in a year with a repeat CT to follow-up her aortic root and thymic hyperplasia should she choose to do so. ? ?Melrose Nakayama, MD ?[facility] ?([phone] ? ?

## 2022-02-19 ENCOUNTER — Encounter: Payer: BC Managed Care – PPO | Admitting: Thoracic Surgery (Cardiothoracic Vascular Surgery)

## 2022-02-20 ENCOUNTER — Encounter: Payer: Self-pay | Admitting: Interventional Cardiology

## 2022-02-23 ENCOUNTER — Other Ambulatory Visit: Payer: Self-pay

## 2022-02-23 DIAGNOSIS — R61 Generalized hyperhidrosis: Secondary | ICD-10-CM

## 2022-02-23 DIAGNOSIS — R29818 Other symptoms and signs involving the nervous system: Secondary | ICD-10-CM

## 2022-02-26 DIAGNOSIS — R079 Chest pain, unspecified: Secondary | ICD-10-CM | POA: Diagnosis not present

## 2022-03-05 ENCOUNTER — Telehealth: Payer: Self-pay

## 2022-03-05 NOTE — Telephone Encounter (Signed)
Full vm on cell, vm left on home phone. Will send MyChart message, ?

## 2022-08-13 ENCOUNTER — Encounter: Payer: Self-pay | Admitting: Neurology

## 2023-01-11 ENCOUNTER — Other Ambulatory Visit: Payer: Self-pay | Admitting: Thoracic Surgery (Cardiothoracic Vascular Surgery)

## 2023-01-11 DIAGNOSIS — I7121 Aneurysm of the ascending aorta, without rupture: Secondary | ICD-10-CM

## 2023-02-19 ENCOUNTER — Telehealth: Payer: Self-pay | Admitting: Neurology

## 2023-02-19 NOTE — Telephone Encounter (Signed)
Pt called in stating we referred her to a sleep place and she had a question about which medicines she can take during the sleep study today. She then realized our office did not order the sleep study and is going to call the correct office

## 2023-02-23 ENCOUNTER — Inpatient Hospital Stay: Admission: RE | Admit: 2023-02-23 | Payer: BC Managed Care – PPO | Source: Ambulatory Visit

## 2023-02-23 ENCOUNTER — Ambulatory Visit: Payer: BC Managed Care – PPO | Admitting: Thoracic Surgery (Cardiothoracic Vascular Surgery)

## 2023-04-27 ENCOUNTER — Ambulatory Visit: Payer: BC Managed Care – PPO | Admitting: Thoracic Surgery (Cardiothoracic Vascular Surgery)

## 2023-04-27 ENCOUNTER — Inpatient Hospital Stay: Admission: RE | Admit: 2023-04-27 | Payer: BC Managed Care – PPO | Source: Ambulatory Visit

## 2023-04-27 ENCOUNTER — Encounter: Payer: Self-pay | Admitting: Thoracic Surgery (Cardiothoracic Vascular Surgery)
